# Patient Record
Sex: Male | Born: 1970 | Race: White | Hispanic: No | Marital: Married | Smoking: Current every day smoker
Health system: Southern US, Community
[De-identification: ages and names within clinical notes are randomized; demographics above are authoritative.]

## PROBLEM LIST (undated history)

## (undated) DIAGNOSIS — M1990 Unspecified osteoarthritis, unspecified site: Secondary | ICD-10-CM

## (undated) DIAGNOSIS — Z889 Allergy status to unspecified drugs, medicaments and biological substances status: Secondary | ICD-10-CM

## (undated) HISTORY — PX: KNEE ARTHROSCOPY W/ MENISCAL REPAIR: SHX1877

## (undated) HISTORY — PX: KNEE ARTHROSCOPY: SUR90

## (undated) HISTORY — PX: SHOULDER ARTHROSCOPY: SHX128

---

## 2010-07-05 ENCOUNTER — Other Ambulatory Visit: Payer: Self-pay | Admitting: Orthopedic Surgery

## 2010-07-05 DIAGNOSIS — M25561 Pain in right knee: Secondary | ICD-10-CM

## 2010-07-12 ENCOUNTER — Ambulatory Visit
Admission: RE | Admit: 2010-07-12 | Discharge: 2010-07-12 | Disposition: A | Discharge status: Home / Self Care | Payer: 59 | Source: Ambulatory Visit | Attending: Orthopedic Surgery | Admitting: Orthopedic Surgery

## 2010-07-12 DIAGNOSIS — M25561 Pain in right knee: Secondary | ICD-10-CM

## 2013-05-27 ENCOUNTER — Other Ambulatory Visit: Payer: Self-pay | Admitting: Orthopedic Surgery

## 2013-05-27 DIAGNOSIS — R609 Edema, unspecified: Secondary | ICD-10-CM

## 2013-05-27 DIAGNOSIS — M25562 Pain in left knee: Secondary | ICD-10-CM

## 2013-05-30 ENCOUNTER — Ambulatory Visit
Admission: RE | Admit: 2013-05-30 | Discharge: 2013-05-30 | Disposition: A | Discharge status: Home / Self Care | Payer: 59 | Source: Ambulatory Visit | Attending: Orthopedic Surgery | Admitting: Orthopedic Surgery

## 2013-05-30 DIAGNOSIS — M25562 Pain in left knee: Secondary | ICD-10-CM

## 2013-05-30 DIAGNOSIS — R609 Edema, unspecified: Secondary | ICD-10-CM

## 2014-01-13 ENCOUNTER — Encounter (INDEPENDENT_AMBULATORY_CARE_PROVIDER_SITE_OTHER): Payer: Self-pay | Admitting: Ophthalmology

## 2014-01-22 ENCOUNTER — Encounter (INDEPENDENT_AMBULATORY_CARE_PROVIDER_SITE_OTHER): Payer: Managed Care, Other (non HMO) | Admitting: Ophthalmology

## 2014-01-22 DIAGNOSIS — H3531 Nonexudative age-related macular degeneration: Secondary | ICD-10-CM

## 2014-01-22 DIAGNOSIS — H43813 Vitreous degeneration, bilateral: Secondary | ICD-10-CM

## 2014-02-28 ENCOUNTER — Encounter (HOSPITAL_COMMUNITY): Payer: Self-pay | Admitting: Emergency Medicine

## 2014-02-28 ENCOUNTER — Emergency Department (HOSPITAL_COMMUNITY): Payer: 59

## 2014-02-28 ENCOUNTER — Emergency Department (HOSPITAL_COMMUNITY)
Admission: EM | Admit: 2014-02-28 | Discharge: 2014-03-01 | Disposition: A | Discharge status: Home / Self Care | Payer: 59 | Attending: Emergency Medicine | Admitting: Emergency Medicine

## 2014-02-28 DIAGNOSIS — S299XXA Unspecified injury of thorax, initial encounter: Secondary | ICD-10-CM | POA: Diagnosis present

## 2014-02-28 DIAGNOSIS — Y9389 Activity, other specified: Secondary | ICD-10-CM | POA: Diagnosis not present

## 2014-02-28 DIAGNOSIS — Y9289 Other specified places as the place of occurrence of the external cause: Secondary | ICD-10-CM | POA: Diagnosis not present

## 2014-02-28 DIAGNOSIS — R1033 Periumbilical pain: Secondary | ICD-10-CM

## 2014-02-28 DIAGNOSIS — S3991XA Unspecified injury of abdomen, initial encounter: Secondary | ICD-10-CM | POA: Diagnosis not present

## 2014-02-28 DIAGNOSIS — S2231XA Fracture of one rib, right side, initial encounter for closed fracture: Secondary | ICD-10-CM

## 2014-02-28 DIAGNOSIS — Y998 Other external cause status: Secondary | ICD-10-CM | POA: Diagnosis not present

## 2014-02-28 LAB — COMPREHENSIVE METABOLIC PANEL
ALK PHOS: 72 U/L (ref 39–117)
ALT: 17 U/L (ref 0–53)
ANION GAP: 3 — AB (ref 5–15)
AST: 19 U/L (ref 0–37)
Albumin: 4 g/dL (ref 3.5–5.2)
BUN: 14 mg/dL (ref 6–23)
CALCIUM: 9.1 mg/dL (ref 8.4–10.5)
CO2: 33 mmol/L — AB (ref 19–32)
Chloride: 102 mmol/L (ref 96–112)
Creatinine, Ser: 1.06 mg/dL (ref 0.50–1.35)
GFR calc Af Amer: >90 mL/min (ref >90)
GFR, EST NON AFRICAN AMERICAN: 84 mL/min — AB (ref >90)
Glucose, Bld: 106 mg/dL — ABNORMAL HIGH (ref 70–99)
Potassium: 3.7 mmol/L (ref 3.5–5.1)
Sodium: 138 mmol/L (ref 135–145)
TOTAL PROTEIN: 6.9 g/dL (ref 6.0–8.3)
Total Bilirubin: 0.6 mg/dL (ref 0.3–1.2)

## 2014-02-28 LAB — CBC WITH DIFFERENTIAL/PLATELET
BASOS ABS: 0 10*3/uL (ref 0.0–0.1)
Basophils Relative: 0 % (ref 0–1)
EOS ABS: 0.5 10*3/uL (ref 0.0–0.7)
EOS PCT: 5 % (ref 0–5)
HCT: 39 % (ref 39.0–52.0)
Hemoglobin: 13.4 g/dL (ref 13.0–17.0)
Lymphocytes Relative: 27 % (ref 12–46)
Lymphs Abs: 2.3 10*3/uL (ref 0.7–4.0)
MCH: 30 pg (ref 26.0–34.0)
MCHC: 34.4 g/dL (ref 30.0–36.0)
MCV: 87.2 fL (ref 78.0–100.0)
MONO ABS: 0.6 10*3/uL (ref 0.1–1.0)
Monocytes Relative: 7 % (ref 3–12)
Neutro Abs: 5.1 10*3/uL (ref 1.7–7.7)
Neutrophils Relative %: 61 % (ref 43–77)
Platelets: 340 10*3/uL (ref 150–400)
RBC: 4.47 MIL/uL (ref 4.22–5.81)
RDW: 12.9 % (ref 11.5–15.5)
WBC: 8.4 10*3/uL (ref 4.0–10.5)

## 2014-02-28 LAB — URINALYSIS, ROUTINE W REFLEX MICROSCOPIC
Bilirubin Urine: NEGATIVE
Glucose, UA: NEGATIVE mg/dL
Hgb urine dipstick: NEGATIVE
Ketones, ur: NEGATIVE mg/dL
Leukocytes, UA: NEGATIVE
Nitrite: NEGATIVE
Protein, ur: NEGATIVE mg/dL
SPECIFIC GRAVITY, URINE: 1.012 (ref 1.005–1.030)
Urobilinogen, UA: 0.2 mg/dL (ref 0.0–1.0)
pH: 6 (ref 5.0–8.0)

## 2014-02-28 LAB — LIPASE, BLOOD: LIPASE: 29 U/L (ref 11–59)

## 2014-02-28 MED ORDER — IOHEXOL 300 MG/ML  SOLN
25.0000 mL | Freq: Once | INTRAMUSCULAR | Status: AC | PRN
  Administered 2014-02-28: 25 mL via ORAL

## 2014-02-28 MED ORDER — SODIUM CHLORIDE 0.9 % IV SOLN
INTRAVENOUS | Status: DC
  Administered 2014-02-28: 23:00:00 via INTRAVENOUS

## 2014-02-28 MED ORDER — IOHEXOL 300 MG/ML  SOLN
100.0000 mL | Freq: Once | INTRAMUSCULAR | Status: AC | PRN
  Administered 2014-02-28: 100 mL via INTRAVENOUS

## 2014-02-28 NOTE — ED Notes (Signed)
CT notified pt done with contrast. 
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
## 2014-03-01 MED ORDER — CEPHALEXIN 500 MG PO CAPS: 500.0000 mg | ORAL_CAPSULE | Freq: Four times a day (QID) | ORAL | Status: DC

## 2014-03-01 MED ORDER — OXYCODONE-ACETAMINOPHEN 5-325 MG PO TABS: ORAL_TABLET | ORAL | Status: DC

## 2014-03-01 MED ORDER — SULFAMETHOXAZOLE-TRIMETHOPRIM 800-160 MG PO TABS
1.0000 | ORAL_TABLET | Freq: Once | ORAL | Status: AC
  Administered 2014-03-01: 1 via ORAL
  Filled 2014-03-01: qty 1

## 2014-03-01 MED ORDER — CEPHALEXIN 250 MG PO CAPS
500.0000 mg | ORAL_CAPSULE | Freq: Once | ORAL | Status: AC
  Administered 2014-03-01: 500 mg via ORAL
  Filled 2014-03-01: qty 2

## 2014-03-01 MED ORDER — KETOROLAC TROMETHAMINE 30 MG/ML IJ SOLN
30.0000 mg | Freq: Once | INTRAMUSCULAR | Status: AC
  Administered 2014-03-01: 30 mg via INTRAVENOUS
  Filled 2014-03-01: qty 1

## 2014-03-01 MED ORDER — SULFAMETHOXAZOLE-TRIMETHOPRIM 800-160 MG PO TABS: 1.0000 | ORAL_TABLET | Freq: Two times a day (BID) | ORAL | Status: DC

## 2014-03-01 NOTE — ED Notes (Signed)
PA Nicole at bedside. 

## 2014-03-01 NOTE — Discharge Instructions (Signed)
Take percocet for breakthrough pain, do not drink alcohol, drive, care for children or do other critical tasks while taking percocet.   It is very important that you take deep breaths to prevent lung collapse and infection.  Take 10 deep breaths every hour to prevent lung collapse.  If you develop cough, fever or shortness of breath return immediately to the emergency room.  If you see signs of infection (warmth, redness, tenderness, pus, sharp increase in pain, fever, red streaking) immediately return to the emergency department.

## 2014-03-01 NOTE — ED Notes (Signed)
Pt provided and educated on [employer].

## 2014-05-09 ENCOUNTER — Other Ambulatory Visit: Payer: Self-pay | Admitting: Orthopedic Surgery

## 2014-05-09 ENCOUNTER — Ambulatory Visit
Admission: RE | Admit: 2014-05-09 | Discharge: 2014-05-09 | Disposition: A | Discharge status: Home / Self Care | Payer: 59 | Source: Ambulatory Visit | Attending: Orthopedic Surgery | Admitting: Orthopedic Surgery

## 2014-05-09 DIAGNOSIS — M25562 Pain in left knee: Secondary | ICD-10-CM

## 2014-11-21 ENCOUNTER — Other Ambulatory Visit: Payer: Self-pay | Admitting: Orthopedic Surgery

## 2014-11-21 DIAGNOSIS — M25512 Pain in left shoulder: Secondary | ICD-10-CM

## 2014-11-28 ENCOUNTER — Ambulatory Visit
Admission: RE | Admit: 2014-11-28 | Discharge: 2014-11-28 | Disposition: A | Discharge status: Home / Self Care | Payer: Managed Care, Other (non HMO) | Source: Ambulatory Visit | Attending: Orthopedic Surgery | Admitting: Orthopedic Surgery

## 2014-11-28 DIAGNOSIS — M25512 Pain in left shoulder: Secondary | ICD-10-CM

## 2015-01-26 ENCOUNTER — Ambulatory Visit (INDEPENDENT_AMBULATORY_CARE_PROVIDER_SITE_OTHER): Payer: Managed Care, Other (non HMO) | Admitting: Ophthalmology

## 2015-03-02 ENCOUNTER — Ambulatory Visit (INDEPENDENT_AMBULATORY_CARE_PROVIDER_SITE_OTHER): Payer: Managed Care, Other (non HMO) | Admitting: Ophthalmology

## 2015-07-31 ENCOUNTER — Other Ambulatory Visit: Payer: Self-pay | Admitting: Orthopedic Surgery

## 2015-08-27 ENCOUNTER — Other Ambulatory Visit (HOSPITAL_COMMUNITY): Payer: Self-pay

## 2015-08-27 NOTE — Pre-Procedure Instructions (Signed)
Aliene AltesMichael S Stockley  08/27/2015     Your procedure is scheduled on : Monday September 07, 2015 at 10:30 AM.  [facility] Admitting at 8:30 AM.  Call this number if you have problems the morning of surgery: [phone]    Remember:  Do not eat food or drink liquids after midnight.  Take these medicines the morning of surgery with A SIP OF WATER : NONE  Stop taking any vitamins, herbal medications, NSAIDs, Ibuprofen, Advil, Motrin,Aleve, etc on Monday August 14th   Do not wear jewelry.  Do not wear lotions, powders, or cologne.    Men may shave face and neck.  Do not bring valuables to the hospital.  [facility] is not responsible for any belongings or valuables.  Contacts, dentures or bridgework may not be worn into surgery.  Leave your suitcase in the car.  After surgery it may be brought to your room.  For patients admitted to the hospital, discharge time will be determined by your treatment team.  Patients discharged the day of surgery will not be allowed to drive home.   Name and phone number of your driver:    Special instructions:  Shower using CHG soap the night before and the morning of your surgery  Please read over the following fact sheets that you were given. MRSA Information and Surgical Site Infection Prevention

## 2015-08-28 ENCOUNTER — Ambulatory Visit (HOSPITAL_COMMUNITY)
Admission: RE | Admit: 2015-08-28 | Discharge: 2015-08-28 | Disposition: A | Discharge status: Home / Self Care | Payer: Managed Care, Other (non HMO) | Source: Ambulatory Visit | Attending: Orthopedic Surgery | Admitting: Orthopedic Surgery

## 2015-08-28 ENCOUNTER — Other Ambulatory Visit (HOSPITAL_COMMUNITY): Payer: Self-pay

## 2015-08-28 ENCOUNTER — Encounter (HOSPITAL_COMMUNITY)
Admission: RE | Admit: 2015-08-28 | Discharge: 2015-08-28 | Disposition: A | Discharge status: Home / Self Care | Payer: Managed Care, Other (non HMO) | Source: Ambulatory Visit | Attending: Orthopedic Surgery | Admitting: Orthopedic Surgery

## 2015-08-28 ENCOUNTER — Encounter (HOSPITAL_COMMUNITY): Payer: Self-pay

## 2015-08-28 DIAGNOSIS — Z01812 Encounter for preprocedural laboratory examination: Secondary | ICD-10-CM | POA: Insufficient documentation

## 2015-08-28 DIAGNOSIS — Z01818 Encounter for other preprocedural examination: Secondary | ICD-10-CM | POA: Diagnosis present

## 2015-08-28 DIAGNOSIS — M1712 Unilateral primary osteoarthritis, left knee: Secondary | ICD-10-CM | POA: Insufficient documentation

## 2015-08-28 LAB — CBC WITH DIFFERENTIAL/PLATELET
Basophils Absolute: 0.1 10*3/uL (ref 0.0–0.1)
Basophils Relative: 1 %
EOS ABS: 0.3 10*3/uL (ref 0.0–0.7)
EOS PCT: 4 %
HCT: 43.2 % (ref 39.0–52.0)
Hemoglobin: 14.4 g/dL (ref 13.0–17.0)
LYMPHS ABS: 2.1 10*3/uL (ref 0.7–4.0)
Lymphocytes Relative: 30 %
MCH: 29.8 pg (ref 26.0–34.0)
MCHC: 33.3 g/dL (ref 30.0–36.0)
MCV: 89.4 fL (ref 78.0–100.0)
MONOS PCT: 7 %
Monocytes Absolute: 0.5 10*3/uL (ref 0.1–1.0)
Neutro Abs: 4 10*3/uL (ref 1.7–7.7)
Neutrophils Relative %: 58 %
PLATELETS: 314 10*3/uL (ref 150–400)
RBC: 4.83 MIL/uL (ref 4.22–5.81)
RDW: 12.5 % (ref 11.5–15.5)
WBC: 6.9 10*3/uL (ref 4.0–10.5)

## 2015-08-28 LAB — COMPREHENSIVE METABOLIC PANEL
ALK PHOS: 59 U/L (ref 38–126)
ALT: 23 U/L (ref 17–63)
ANION GAP: 11 (ref 5–15)
AST: 23 U/L (ref 15–41)
Albumin: 4.4 g/dL (ref 3.5–5.0)
BUN: 13 mg/dL (ref 6–20)
CALCIUM: 9.6 mg/dL (ref 8.9–10.3)
CHLORIDE: 100 mmol/L — AB (ref 101–111)
CO2: 27 mmol/L (ref 22–32)
Creatinine, Ser: 0.96 mg/dL (ref 0.61–1.24)
Glucose, Bld: 92 mg/dL (ref 65–99)
Potassium: 3.7 mmol/L (ref 3.5–5.1)
SODIUM: 138 mmol/L (ref 135–145)
Total Bilirubin: 0.9 mg/dL (ref 0.3–1.2)
Total Protein: 7.2 g/dL (ref 6.5–8.1)

## 2015-08-28 LAB — URINALYSIS, ROUTINE W REFLEX MICROSCOPIC
Bilirubin Urine: NEGATIVE
Glucose, UA: NEGATIVE mg/dL
HGB URINE DIPSTICK: NEGATIVE
KETONES UR: NEGATIVE mg/dL
Leukocytes, UA: NEGATIVE
Nitrite: NEGATIVE
PROTEIN: NEGATIVE mg/dL
Specific Gravity, Urine: 1.009 (ref 1.005–1.030)
pH: 6 (ref 5.0–8.0)

## 2015-08-28 LAB — PROTIME-INR
INR: 1.06
PROTHROMBIN TIME: 13.8 s (ref 11.4–15.2)

## 2015-08-28 LAB — SURGICAL PCR SCREEN
MRSA, PCR: NEGATIVE
STAPHYLOCOCCUS AUREUS: POSITIVE — AB

## 2015-08-28 LAB — APTT: aPTT: 28 seconds (ref 24–36)

## 2015-08-28 NOTE — Progress Notes (Signed)
Prescription for Mupirocin called into [facility], and Nurse called and left a voicemail instructing patient to pick up ointment at his earliest convenience. Direct call back number left.

## 2015-08-28 NOTE — Progress Notes (Signed)
Patient denied having any acute cardiac or pulmonary issues, but did inform Nurse that he was diagnosed with sleep apnea several years ago, however he does not wear CPAP as prescribed. Patient stated he needed to "follow up with it," as it is outdated. Sleep apnea results routed to patients PCP.  Patient informed Nurse that he had a EKG done approximately 2 weeks ago at his PCP office. Will request records.

## 2015-08-28 NOTE — Progress Notes (Signed)
   08/28/15 1043  OBSTRUCTIVE SLEEP APNEA  Have you ever been diagnosed with sleep apnea through a sleep study? Yes  If yes, do you have and use a CPAP or BPAP machine every night? 0 (Pt does not wear CPAP)  Do you snore loudly (loud enough to be heard through closed doors)?  1  Do you often feel tired, fatigued, or sleepy during the daytime (such as falling asleep during driving or talking to someone)? 0  Has anyone observed you stop breathing during your sleep? 1  Do you have, or are you being treated for high blood pressure? 0  BMI more than 35 kg/m2? 0  Age > 50 (1-yes) 0  Neck circumference greater than:Male 16 inches or larger, Male 17inches or larger? 1  Male Gender (Yes=1) 1  Obstructive Sleep Apnea Score 4  Score 5 or greater  Results sent to PCP   This patient has screened at risk for sleep apnea using the STOP Bang tool used during a pre-surgical visit. Patient stated he was diagnosed with sleep apnea several years ago, but needs to follow up with this because his equipment is outdated.

## 2015-08-29 LAB — URINE CULTURE: CULTURE: NO GROWTH

## 2015-08-31 NOTE — Progress Notes (Signed)
[facility] in regards to re-requesting most recent labs,EKG,and OV

## 2015-09-04 MED ORDER — BUPIVACAINE LIPOSOME 1.3 % IJ SUSP
20.0000 mL | INTRAMUSCULAR | Status: AC
  Administered 2015-09-07: 20 mL
  Filled 2015-09-04: qty 20

## 2015-09-04 MED ORDER — TRANEXAMIC ACID 1000 MG/10ML IV SOLN
1000.0000 mg | INTRAVENOUS | Status: AC
  Administered 2015-09-07: 1000 mg via INTRAVENOUS
  Filled 2015-09-04: qty 10

## 2015-09-06 MED ORDER — SODIUM CHLORIDE 0.9 % IV SOLN: INTRAVENOUS | Status: DC

## 2015-09-06 MED ORDER — ACETAMINOPHEN 500 MG PO TABS
1000.0000 mg | ORAL_TABLET | Freq: Once | ORAL | Status: AC
  Administered 2015-09-07: 1000 mg via ORAL
  Filled 2015-09-06: qty 2

## 2015-09-06 MED ORDER — CEFAZOLIN SODIUM-DEXTROSE 2-4 GM/100ML-% IV SOLN
2.0000 g | INTRAVENOUS | Status: AC
  Administered 2015-09-07: 2 g via INTRAVENOUS
  Filled 2015-09-06: qty 100

## 2015-09-07 ENCOUNTER — Inpatient Hospital Stay (HOSPITAL_COMMUNITY): Payer: Managed Care, Other (non HMO) | Admitting: Certified Registered Nurse Anesthetist

## 2015-09-07 ENCOUNTER — Encounter (HOSPITAL_COMMUNITY): Payer: Self-pay | Admitting: Surgery

## 2015-09-07 ENCOUNTER — Inpatient Hospital Stay (HOSPITAL_COMMUNITY)
Admission: RE | Admit: 2015-09-07 | Discharge: 2015-09-08 | DRG: 470 | Disposition: A | Discharge status: Home / Self Care | Payer: Managed Care, Other (non HMO) | Source: Ambulatory Visit | Attending: Orthopedic Surgery | Admitting: Orthopedic Surgery

## 2015-09-07 ENCOUNTER — Encounter (HOSPITAL_COMMUNITY)
Admission: RE | Disposition: A | Discharge status: Home / Self Care | Payer: Self-pay | Source: Ambulatory Visit | Attending: Orthopedic Surgery

## 2015-09-07 DIAGNOSIS — M1712 Unilateral primary osteoarthritis, left knee: Secondary | ICD-10-CM | POA: Diagnosis present

## 2015-09-07 DIAGNOSIS — D62 Acute posthemorrhagic anemia: Secondary | ICD-10-CM | POA: Diagnosis not present

## 2015-09-07 DIAGNOSIS — Z96659 Presence of unspecified artificial knee joint: Secondary | ICD-10-CM

## 2015-09-07 DIAGNOSIS — F17290 Nicotine dependence, other tobacco product, uncomplicated: Secondary | ICD-10-CM | POA: Diagnosis present

## 2015-09-07 HISTORY — DX: Allergy status to unspecified drugs, medicaments and biological substances: Z88.9

## 2015-09-07 HISTORY — DX: Unspecified osteoarthritis, unspecified site: M19.90

## 2015-09-07 HISTORY — PX: TOTAL KNEE ARTHROPLASTY: SHX125

## 2015-09-07 SURGERY — ARTHROPLASTY, KNEE, TOTAL
Anesthesia: Spinal | Site: Knee | Laterality: Left

## 2015-09-07 MED ORDER — SODIUM CHLORIDE FLUSH 0.9 % IV SOLN
INTRAVENOUS | Status: AC
  Filled 2015-09-07: qty 20

## 2015-09-07 MED ORDER — SODIUM CHLORIDE 0.9 % IJ SOLN
INTRAMUSCULAR | Status: DC | PRN
  Administered 2015-09-07: 20 mL

## 2015-09-07 MED ORDER — SODIUM CHLORIDE 0.9 % IV SOLN
INTRAVENOUS | Status: DC
  Administered 2015-09-07: 16:00:00 via INTRAVENOUS

## 2015-09-07 MED ORDER — BISACODYL 5 MG PO TBEC: 5.0000 mg | DELAYED_RELEASE_TABLET | Freq: Every day | ORAL | Status: DC | PRN

## 2015-09-07 MED ORDER — BUPIVACAINE-EPINEPHRINE (PF) 0.25% -1:200000 IJ SOLN
INTRAMUSCULAR | Status: DC | PRN
  Administered 2015-09-07: 30 mL

## 2015-09-07 MED ORDER — SENNOSIDES-DOCUSATE SODIUM 8.6-50 MG PO TABS
1.0000 | ORAL_TABLET | Freq: Every evening | ORAL | Status: DC | PRN
Start: 2015-09-07 — End: 2015-09-08

## 2015-09-07 MED ORDER — ACETAMINOPHEN 500 MG PO TABS
1000.0000 mg | ORAL_TABLET | Freq: Four times a day (QID) | ORAL | Status: AC
  Administered 2015-09-07 – 2015-09-08 (×4): 1000 mg via ORAL
  Filled 2015-09-07 (×4): qty 2

## 2015-09-07 MED ORDER — METOCLOPRAMIDE HCL 5 MG/ML IJ SOLN: 5.0000 mg | Freq: Three times a day (TID) | INTRAMUSCULAR | Status: DC | PRN

## 2015-09-07 MED ORDER — SODIUM CHLORIDE 0.9 % IR SOLN
Status: DC | PRN
  Administered 2015-09-07: 3000 mL

## 2015-09-07 MED ORDER — METOCLOPRAMIDE HCL 5 MG PO TABS: 5.0000 mg | ORAL_TABLET | Freq: Three times a day (TID) | ORAL | Status: DC | PRN

## 2015-09-07 MED ORDER — ONDANSETRON HCL 4 MG/2ML IJ SOLN
INTRAMUSCULAR | Status: AC
  Filled 2015-09-07: qty 2

## 2015-09-07 MED ORDER — MIDAZOLAM HCL 2 MG/2ML IJ SOLN
INTRAMUSCULAR | Status: AC
  Filled 2015-09-07: qty 2

## 2015-09-07 MED ORDER — MENTHOL 3 MG MT LOZG: 1.0000 | LOZENGE | OROMUCOSAL | Status: DC | PRN

## 2015-09-07 MED ORDER — 0.9 % SODIUM CHLORIDE (POUR BTL) OPTIME
TOPICAL | Status: DC | PRN
  Administered 2015-09-07: 1000 mL

## 2015-09-07 MED ORDER — PHENYLEPHRINE 40 MCG/ML (10ML) SYRINGE FOR IV PUSH (FOR BLOOD PRESSURE SUPPORT)
PREFILLED_SYRINGE | INTRAVENOUS | Status: AC
  Filled 2015-09-07: qty 10

## 2015-09-07 MED ORDER — ROPIVACAINE HCL 5 MG/ML IJ SOLN
INTRAMUSCULAR | Status: DC | PRN
  Administered 2015-09-07: 30 mL via PERINEURAL

## 2015-09-07 MED ORDER — FENTANYL CITRATE (PF) 100 MCG/2ML IJ SOLN
INTRAMUSCULAR | Status: DC | PRN
  Administered 2015-09-07: 50 ug via INTRAVENOUS

## 2015-09-07 MED ORDER — PROPOFOL 500 MG/50ML IV EMUL
INTRAVENOUS | Status: DC | PRN
  Administered 2015-09-07: 75 ug/kg/min via INTRAVENOUS

## 2015-09-07 MED ORDER — ZOLPIDEM TARTRATE 5 MG PO TABS: 5.0000 mg | ORAL_TABLET | Freq: Every evening | ORAL | Status: DC | PRN

## 2015-09-07 MED ORDER — OXYCODONE HCL ER 10 MG PO T12A
10.0000 mg | EXTENDED_RELEASE_TABLET | Freq: Two times a day (BID) | ORAL | Status: DC
  Administered 2015-09-07 – 2015-09-08 (×2): 10 mg via ORAL
  Filled 2015-09-07 (×2): qty 1

## 2015-09-07 MED ORDER — BUPIVACAINE-EPINEPHRINE (PF) 0.25% -1:200000 IJ SOLN
INTRAMUSCULAR | Status: AC
  Filled 2015-09-07: qty 30

## 2015-09-07 MED ORDER — MIDAZOLAM HCL 5 MG/5ML IJ SOLN
INTRAMUSCULAR | Status: DC | PRN
  Administered 2015-09-07: 2 mg via INTRAVENOUS

## 2015-09-07 MED ORDER — OXYCODONE HCL 5 MG PO TABS
5.0000 mg | ORAL_TABLET | ORAL | Status: DC | PRN
  Administered 2015-09-07 – 2015-09-08 (×3): 10 mg via ORAL
  Filled 2015-09-07 (×3): qty 2
  Filled 2015-09-07: qty 1

## 2015-09-07 MED ORDER — LIDOCAINE HCL (CARDIAC) 20 MG/ML IV SOLN
INTRAVENOUS | Status: DC | PRN
  Administered 2015-09-07 (×2): 10 mg via INTRATRACHEAL

## 2015-09-07 MED ORDER — DOCUSATE SODIUM 100 MG PO CAPS
100.0000 mg | ORAL_CAPSULE | Freq: Two times a day (BID) | ORAL | Status: DC
  Administered 2015-09-07 – 2015-09-08 (×2): 100 mg via ORAL
  Filled 2015-09-07 (×2): qty 1

## 2015-09-07 MED ORDER — HYDROMORPHONE HCL 1 MG/ML IJ SOLN
1.0000 mg | INTRAMUSCULAR | Status: DC | PRN
  Administered 2015-09-07 – 2015-09-08 (×7): 1 mg via INTRAVENOUS
  Filled 2015-09-07 (×7): qty 1

## 2015-09-07 MED ORDER — PHENOL 1.4 % MT LIQD: 1.0000 | OROMUCOSAL | Status: DC | PRN

## 2015-09-07 MED ORDER — ROPIVACAINE HCL 5 MG/ML IJ SOLN: INTRAMUSCULAR | Status: DC | PRN

## 2015-09-07 MED ORDER — FENTANYL CITRATE (PF) 100 MCG/2ML IJ SOLN
INTRAMUSCULAR | Status: AC
  Filled 2015-09-07: qty 2

## 2015-09-07 MED ORDER — METHOCARBAMOL 1000 MG/10ML IJ SOLN
500.0000 mg | Freq: Four times a day (QID) | INTRAVENOUS | Status: DC | PRN
  Filled 2015-09-07: qty 5

## 2015-09-07 MED ORDER — CEFAZOLIN IN D5W 1 GM/50ML IV SOLN
1.0000 g | Freq: Four times a day (QID) | INTRAVENOUS | Status: AC
  Administered 2015-09-07 – 2015-09-08 (×2): 1 g via INTRAVENOUS
  Filled 2015-09-07 (×2): qty 50

## 2015-09-07 MED ORDER — ONDANSETRON HCL 4 MG/2ML IJ SOLN
INTRAMUSCULAR | Status: DC | PRN
  Administered 2015-09-07: 4 mg via INTRAVENOUS

## 2015-09-07 MED ORDER — FLUOXETINE HCL 10 MG PO CAPS
10.0000 mg | ORAL_CAPSULE | Freq: Every day | ORAL | Status: DC
  Administered 2015-09-07 – 2015-09-08 (×2): 10 mg via ORAL
  Filled 2015-09-07 (×2): qty 1

## 2015-09-07 MED ORDER — ASPIRIN EC 325 MG PO TBEC
325.0000 mg | DELAYED_RELEASE_TABLET | Freq: Two times a day (BID) | ORAL | Status: DC
  Administered 2015-09-07 – 2015-09-08 (×2): 325 mg via ORAL
  Filled 2015-09-07 (×2): qty 1

## 2015-09-07 MED ORDER — HYDROMORPHONE HCL 1 MG/ML IJ SOLN: 0.2500 mg | INTRAMUSCULAR | Status: DC | PRN

## 2015-09-07 MED ORDER — METHOCARBAMOL 500 MG PO TABS
500.0000 mg | ORAL_TABLET | Freq: Four times a day (QID) | ORAL | Status: DC | PRN
  Administered 2015-09-07 – 2015-09-08 (×4): 500 mg via ORAL
  Filled 2015-09-07 (×4): qty 1

## 2015-09-07 MED ORDER — FENTANYL CITRATE (PF) 100 MCG/2ML IJ SOLN
100.0000 ug | Freq: Once | INTRAMUSCULAR | Status: AC
  Administered 2015-09-07: 100 ug via INTRAVENOUS

## 2015-09-07 MED ORDER — DIPHENHYDRAMINE HCL 12.5 MG/5ML PO ELIX
12.5000 mg | ORAL_SOLUTION | ORAL | Status: DC | PRN
  Administered 2015-09-07: 25 mg via ORAL
  Filled 2015-09-07: qty 10

## 2015-09-07 MED ORDER — FENTANYL CITRATE (PF) 100 MCG/2ML IJ SOLN
INTRAMUSCULAR | Status: AC
  Administered 2015-09-07: 100 ug via INTRAVENOUS
  Filled 2015-09-07: qty 2

## 2015-09-07 MED ORDER — PROPOFOL 10 MG/ML IV BOLUS
INTRAVENOUS | Status: DC | PRN
  Administered 2015-09-07 (×3): 10 mg via INTRAVENOUS

## 2015-09-07 MED ORDER — FLUOXETINE HCL 20 MG PO TABS
10.0000 mg | ORAL_TABLET | Freq: Every day | ORAL | Status: DC
Start: 2015-09-07 — End: 2015-09-07
  Filled 2015-09-07: qty 1

## 2015-09-07 MED ORDER — DEXAMETHASONE SODIUM PHOSPHATE 10 MG/ML IJ SOLN
10.0000 mg | Freq: Once | INTRAMUSCULAR | Status: AC
  Administered 2015-09-08: 10 mg via INTRAVENOUS
  Filled 2015-09-07: qty 1

## 2015-09-07 MED ORDER — CHLORHEXIDINE GLUCONATE 4 % EX LIQD: 60.0000 mL | Freq: Once | CUTANEOUS | Status: DC

## 2015-09-07 MED ORDER — MIDAZOLAM HCL 2 MG/2ML IJ SOLN
2.0000 mg | Freq: Once | INTRAMUSCULAR | Status: AC
  Administered 2015-09-07: 2 mg via INTRAVENOUS

## 2015-09-07 MED ORDER — ACETAMINOPHEN 650 MG RE SUPP: 650.0000 mg | Freq: Four times a day (QID) | RECTAL | Status: DC | PRN

## 2015-09-07 MED ORDER — LACTATED RINGERS IV SOLN
INTRAVENOUS | Status: DC
  Administered 2015-09-07 (×3): via INTRAVENOUS

## 2015-09-07 MED ORDER — PROPOFOL 10 MG/ML IV BOLUS
INTRAVENOUS | Status: AC
  Filled 2015-09-07: qty 20

## 2015-09-07 MED ORDER — ONDANSETRON HCL 4 MG PO TABS
4.0000 mg | ORAL_TABLET | Freq: Four times a day (QID) | ORAL | Status: DC | PRN
  Administered 2015-09-08: 4 mg via ORAL
  Filled 2015-09-07: qty 1

## 2015-09-07 MED ORDER — MIDAZOLAM HCL 2 MG/2ML IJ SOLN
INTRAMUSCULAR | Status: AC
  Administered 2015-09-07: 2 mg via INTRAVENOUS
  Filled 2015-09-07: qty 2

## 2015-09-07 MED ORDER — BUPIVACAINE IN DEXTROSE 0.75-8.25 % IT SOLN
INTRATHECAL | Status: DC | PRN
  Administered 2015-09-07: 15 mg via INTRATHECAL

## 2015-09-07 MED ORDER — ALUM & MAG HYDROXIDE-SIMETH 200-200-20 MG/5ML PO SUSP: 30.0000 mL | ORAL | Status: DC | PRN

## 2015-09-07 MED ORDER — ACETAMINOPHEN 325 MG PO TABS
650.0000 mg | ORAL_TABLET | Freq: Four times a day (QID) | ORAL | Status: DC | PRN
Start: 2015-09-07 — End: 2015-09-08

## 2015-09-07 MED ORDER — ONDANSETRON HCL 4 MG/2ML IJ SOLN
4.0000 mg | Freq: Four times a day (QID) | INTRAMUSCULAR | Status: DC | PRN
  Administered 2015-09-07: 4 mg via INTRAVENOUS

## 2015-09-07 MED ORDER — MEPERIDINE HCL 25 MG/ML IJ SOLN: 6.2500 mg | INTRAMUSCULAR | Status: DC | PRN

## 2015-09-07 MED ORDER — TRANEXAMIC ACID 1000 MG/10ML IV SOLN
1000.0000 mg | Freq: Once | INTRAVENOUS | Status: AC
  Administered 2015-09-07: 1000 mg via INTRAVENOUS
  Filled 2015-09-07: qty 10

## 2015-09-07 MED ORDER — PROMETHAZINE HCL 25 MG/ML IJ SOLN: 6.2500 mg | INTRAMUSCULAR | Status: DC | PRN

## 2015-09-07 MED ORDER — FLEET ENEMA 7-19 GM/118ML RE ENEM: 1.0000 | ENEMA | Freq: Once | RECTAL | Status: DC | PRN

## 2015-09-07 SURGICAL SUPPLY — 64 items
BANDAGE ACE 6X5 VEL STRL LF (GAUZE/BANDAGES/DRESSINGS) ×2 IMPLANT
BANDAGE ESMARK 6X9 LF (GAUZE/BANDAGES/DRESSINGS) ×1 IMPLANT
BLADE SAGITTAL 13X1.27X60 (BLADE) ×2 IMPLANT
BLADE SAGITTAL 13X1.27X60MM (BLADE) ×1
BLADE SAW SGTL 83.5X18.5 (BLADE) ×3 IMPLANT
BLADE SURG 10 STRL SS (BLADE) ×3 IMPLANT
BNDG CMPR 9X6 STRL LF SNTH (GAUZE/BANDAGES/DRESSINGS) ×1
BNDG ESMARK 6X9 LF (GAUZE/BANDAGES/DRESSINGS) ×3
BOWL SMART MIX CTS (DISPOSABLE) ×3 IMPLANT
CAPT KNEE TOTAL 3 ×3 IMPLANT
CEMENT BONE SIMPLEX SPEEDSET (Cement) ×6 IMPLANT
CLOSURE WOUND 1/2 X4 (GAUZE/BANDAGES/DRESSINGS) ×1
COVER SURGICAL LIGHT HANDLE (MISCELLANEOUS) ×3 IMPLANT
CUFF TOURNIQUET SINGLE 34IN LL (TOURNIQUET CUFF) ×3 IMPLANT
DRAPE EXTREMITY T 121X128X90 (DRAPE) ×3 IMPLANT
DRAPE INCISE IOBAN 66X45 STRL (DRAPES) ×6 IMPLANT
DRAPE PROXIMA HALF (DRAPES) IMPLANT
DRAPE U-SHAPE 47X51 STRL (DRAPES) ×3 IMPLANT
DRSG AQUACEL AG ADV 3.5X10 (GAUZE/BANDAGES/DRESSINGS) ×3 IMPLANT
DRSG PAD ABDOMINAL 8X10 ST (GAUZE/BANDAGES/DRESSINGS) ×3 IMPLANT
DURAPREP 26ML APPLICATOR (WOUND CARE) ×6 IMPLANT
ELECT REM PT RETURN 9FT ADLT (ELECTROSURGICAL) ×3
ELECTRODE REM PT RTRN 9FT ADLT (ELECTROSURGICAL) ×1 IMPLANT
GLOVE BIO SURGEON STRL SZ7.5 (GLOVE) ×2 IMPLANT
GLOVE BIOGEL M 7.0 STRL (GLOVE) IMPLANT
GLOVE BIOGEL PI IND STRL 7.5 (GLOVE) IMPLANT
GLOVE BIOGEL PI IND STRL 8 (GLOVE) IMPLANT
GLOVE BIOGEL PI IND STRL 8.5 (GLOVE) ×5 IMPLANT
GLOVE BIOGEL PI INDICATOR 7.5 (GLOVE)
GLOVE BIOGEL PI INDICATOR 8 (GLOVE) ×2
GLOVE BIOGEL PI INDICATOR 8.5 (GLOVE) ×2
GLOVE SURG ORTHO 8.0 STRL STRW (GLOVE) ×14 IMPLANT
GOWN STRL REUS W/ TWL LRG LVL3 (GOWN DISPOSABLE) ×1 IMPLANT
GOWN STRL REUS W/ TWL XL LVL3 (GOWN DISPOSABLE) ×2 IMPLANT
GOWN STRL REUS W/TWL 2XL LVL3 (GOWN DISPOSABLE) ×3 IMPLANT
GOWN STRL REUS W/TWL LRG LVL3 (GOWN DISPOSABLE)
GOWN STRL REUS W/TWL XL LVL3 (GOWN DISPOSABLE) ×6
HANDPIECE INTERPULSE COAX TIP (DISPOSABLE) ×3
HOOD PEEL AWAY FACE SHEILD DIS (HOOD) ×9 IMPLANT
KIT BASIN OR (CUSTOM PROCEDURE TRAY) ×3 IMPLANT
KIT ROOM TURNOVER OR (KITS) ×3 IMPLANT
KNEE CAPITATED TOTAL 3 IMPLANT
MANIFOLD NEPTUNE II (INSTRUMENTS) ×3 IMPLANT
NEEDLE 22X1 1/2 (OR ONLY) (NEEDLE) ×6 IMPLANT
NS IRRIG 1000ML POUR BTL (IV SOLUTION) ×3 IMPLANT
PACK TOTAL JOINT (CUSTOM PROCEDURE TRAY) ×3 IMPLANT
PACK UNIVERSAL I (CUSTOM PROCEDURE TRAY) ×3 IMPLANT
PAD ARMBOARD 7.5X6 YLW CONV (MISCELLANEOUS) ×6 IMPLANT
SET HNDPC FAN SPRY TIP SCT (DISPOSABLE) ×1 IMPLANT
STAPLER VISISTAT 35W (STAPLE) ×3 IMPLANT
STRIP CLOSURE SKIN 1/2X4 (GAUZE/BANDAGES/DRESSINGS) ×2 IMPLANT
SUCTION FRAZIER HANDLE 10FR (MISCELLANEOUS) ×2
SUCTION TUBE FRAZIER 10FR DISP (MISCELLANEOUS) ×1 IMPLANT
SUT BONE WAX W31G (SUTURE) ×3 IMPLANT
SUT VIC AB 0 CTB1 27 (SUTURE) ×6 IMPLANT
SUT VIC AB 1 CT1 27 (SUTURE) ×9
SUT VIC AB 1 CT1 27XBRD ANBCTR (SUTURE) ×2 IMPLANT
SUT VIC AB 2-0 CT1 27 (SUTURE) ×6
SUT VIC AB 2-0 CT1 TAPERPNT 27 (SUTURE) ×2 IMPLANT
SYR 20CC LL (SYRINGE) ×6 IMPLANT
TOWEL OR 17X24 6PK STRL BLUE (TOWEL DISPOSABLE) ×3 IMPLANT
TOWEL OR 17X26 10 PK STRL BLUE (TOWEL DISPOSABLE) ×3 IMPLANT
WATER STERILE IRR 1000ML POUR (IV SOLUTION) ×2 IMPLANT
YANKAUER SUCT BULB TIP NO VENT (SUCTIONS) ×2 IMPLANT

## 2015-09-07 NOTE — Anesthesia Procedure Notes (Addendum)
Anesthesia Regional Block:  Adductor canal block  Pre-Anesthetic Checklist: ,, timeout performed, Correct Patient, Correct Site, Correct Laterality, Correct Procedure, Correct Position, site marked, Risks and benefits discussed, pre-op evaluation,  At surgeon's request and post-op pain management  Laterality: Left  Prep: Maximum Sterile Barrier Precautions used, chloraprep       Needles:  Injection technique: Single-shot  Needle Type: Echogenic Stimulator Needle     Needle Length: 9cm 9 cm Needle Gauge: 21 and 21 G    Additional Needles:  Procedures: ultrasound guided (picture in chart) Adductor canal block Narrative:  Start time: 09/07/2015 9:50 AM End time: 09/07/2015 10:00 AM Injection made incrementally with aspirations every 5 mL. Anesthesiologist: Gaynelle AduFITZGERALD, Maleko Greulich  Additional Notes: 2% Lidocaine skin wheel.

## 2015-09-07 NOTE — Anesthesia Procedure Notes (Signed)
Procedure Name: MAC Date/Time: 09/07/2015 11:31 AM Performed by: Little IshikawaMERCER, Leontina Skidmore L Pre-anesthesia Checklist: Timeout performed, Patient being monitored, Suction available, Emergency Drugs available and Patient identified Patient Re-evaluated:Patient Re-evaluated prior to inductionOxygen Delivery Method: Nasal cannula Placement Confirmation: positive ETCO2

## 2015-09-07 NOTE — Transfer of Care (Signed)
Immediate Anesthesia Transfer of Care Note  Patient: Danny Herman  Procedure(s) Performed: Procedure(s): LEFT TOTAL KNEE ARTHROPLASTY (Left)  Patient Location: PACU  Anesthesia Type:MAC and Spinal  Level of Consciousness: awake, alert  and oriented  Airway & Oxygen Therapy: Patient Spontanous Breathing and Patient connected to nasal cannula oxygen  Post-op Assessment: Report given to RN and Post -op Vital signs reviewed and stable  Post vital signs: Reviewed and stable  Last Vitals:  Vitals:   09/07/15 1005 09/07/15 1010  BP: (!) 152/88 (!) 147/77  Pulse: 65 64  Resp: 11 10  Temp:      Last Pain:  Vitals:   09/07/15 0943  TempSrc: Oral  PainSc:       Patients Stated Pain Goal: 4 (09/07/15 0931)  Complications: No apparent anesthesia complications

## 2015-09-07 NOTE — H&P (Signed)
Danny AltesMichael S Herman MRN:  [id number] DOB/SEX:  1970/05/15/male  CHIEF COMPLAINT:  Painful left Knee  HISTORY: Patient is a 45 y.o. male presented with a history of pain in the left knee. Onset of symptoms was gradual starting a few years ago with gradually worsening course since that time. Patient has been treated conservatively with over-the-counter NSAIDs and activity modification. Patient currently rates pain in the knee at 10 out of 10 with activity. There is pain at night.  PAST MEDICAL HISTORY: There are no active problems to display for this patient.  No past medical history on file. Past Surgical History:  Procedure Laterality Date  . KNEE ARTHROSCOPY Left   . KNEE ARTHROSCOPY W/ MENISCAL REPAIR Right   . SHOULDER ARTHROSCOPY Left      MEDICATIONS:   No prescriptions prior to admission.    ALLERGIES:   Allergies  Allergen Reactions  . No Known Allergies     REVIEW OF SYSTEMS:  A comprehensive review of systems was negative except for: Musculoskeletal: positive for arthralgias and bone pain   FAMILY HISTORY:  No family history on file.  SOCIAL HISTORY:   Social History  Substance Use Topics  . Smoking status: Current Every Day Smoker    Types: E-cigarettes  . Smokeless tobacco: Current User  . Alcohol use Yes     EXAMINATION:  Vital signs in last 24 hours:    There were no vitals taken for this visit.  General Appearance:    Alert, cooperative, no distress, appears stated age  Head:    Normocephalic, without obvious abnormality, atraumatic  Eyes:    PERRL, conjunctiva/corneas clear, EOM's intact, fundi    benign, both eyes       Ears:    Normal TM's and external ear canals, both ears  Nose:   Nares normal, septum midline, mucosa normal, no drainage    or sinus tenderness  Throat:   Lips, mucosa, and tongue normal; teeth and gums normal  Neck:   Supple, symmetrical, trachea midline, no adenopathy;       thyroid:  No enlargement/tenderness/nodules; no  carotid   bruit or JVD  Back:     Symmetric, no curvature, ROM normal, no CVA tenderness  Lungs:     Clear to auscultation bilaterally, respirations unlabored  Chest wall:    No tenderness or deformity  Heart:    Regular rate and rhythm, S1 and S2 normal, no murmur, rub   or gallop  Abdomen:     Soft, non-tender, bowel sounds active all four quadrants,    no masses, no organomegaly  Genitalia:    Normal male without lesion, discharge or tenderness  Rectal:    Normal tone, normal prostate, no masses or tenderness;   guaiac negative stool  Extremities:   Extremities normal, atraumatic, no cyanosis or edema  Pulses:   2+ and symmetric all extremities  Skin:   Skin color, texture, turgor normal, no rashes or lesions  Lymph nodes:   Cervical, supraclavicular, and axillary nodes normal  Neurologic:   CNII-XII intact. Normal strength, sensation and reflexes      throughout     Musculoskeletal:  ROM 0-120, Ligaments intact,  Imaging Review Plain radiographs demonstrate severe degenerative joint disease of the left knee. The overall alignment is neutral. The bone quality appears to be excellent for age and reported activity level.  Assessment/Plan: Primary osteoarthritis, left knee   The patient history, physical examination and imaging studies are consistent with advanced degenerative joint disease of  the left knee. The patient has failed conservative treatment.  The clearance notes were reviewed.  After discussion with the patient it was felt that Total Knee Replacement was indicated. The procedure,  risks, and benefits of total knee arthroplasty were presented and reviewed. The risks including but not limited to aseptic loosening, infection, blood clots, vascular injury, stiffness, patella tracking problems complications among others were discussed. The patient acknowledged the explanation, agreed to proceed with the plan. Danny SandiferColby Alan Herman 09/07/2015, 6:35 AM

## 2015-09-07 NOTE — Anesthesia Procedure Notes (Signed)
Spinal  Patient location during procedure: OR Start time: 09/07/2015 11:32 AM End time: 09/07/2015 11:37 AM Staffing Anesthesiologist: Gaynelle AduFITZGERALD, Mardell Cragg Performed: anesthesiologist  Preanesthetic Checklist Completed: patient identified, surgical consent, pre-op evaluation, timeout performed, IV checked, risks and benefits discussed and monitors and equipment checked Spinal Block Patient position: sitting Prep: Betadine Patient monitoring: cardiac monitor, continuous pulse ox and blood pressure Approach: midline Location: L3-4 Injection technique: single-shot Needle Needle type: Pencan  Needle gauge: 24 G Needle length: 9 cm Assessment Sensory level: T6 Additional Notes Functioning IV was confirmed and monitors were applied. Sterile prep and drape, including hand hygiene and sterile gloves were used. The patient was positioned and the spine was prepped. The skin was anesthetized with lidocaine.  Free flow of clear CSF was obtained prior to injecting local anesthetic into the CSF.  The spinal needle aspirated freely following injection.  The needle was carefully withdrawn.  The patient tolerated the procedure well.

## 2015-09-07 NOTE — Progress Notes (Signed)
Orthopedic Tech Progress Note Patient Details:  Aliene AltesMichael S Guedea 16-May-1970 [id number]  CPM Left Knee CPM Left Knee: On Left Knee Flexion (Degrees): 90 Left Knee Extension (Degrees): 0 Additional Comments: Trapeze bar and foot roll   Saul FordyceJennifer C Burlon Centrella 09/07/2015, 4:08 PM

## 2015-09-07 NOTE — Anesthesia Preprocedure Evaluation (Signed)
Anesthesia Evaluation  Patient identified by MRN, date of birth, ID band Patient awake    Reviewed: Allergy & Precautions, NPO status , Patient's Chart, lab work & pertinent test results  Airway Mallampati: II  TM Distance: >3 FB Neck ROM: Full    Dental no notable dental hx.    Pulmonary Current Smoker,    Pulmonary exam normal breath sounds clear to auscultation       Cardiovascular negative cardio ROS Normal cardiovascular exam Rhythm:Regular Rate:Normal     Neuro/Psych negative neurological ROS  negative psych ROS   GI/Hepatic negative GI ROS, Neg liver ROS,   Endo/Other  negative endocrine ROS  Renal/GU negative Renal ROS     Musculoskeletal negative musculoskeletal ROS (+)   Abdominal   Peds  Hematology negative hematology ROS (+)   Anesthesia Other Findings   Reproductive/Obstetrics negative OB ROS                             Anesthesia Physical Anesthesia Plan  ASA: II  Anesthesia Plan: Spinal   Post-op Pain Management:    Induction: Intravenous  Airway Management Planned:   Additional Equipment:   Intra-op Plan:   Post-operative Plan:   Informed Consent: I have reviewed the patients History and Physical, chart, labs and discussed the procedure including the risks, benefits and alternatives for the proposed anesthesia with the patient or authorized representative who has indicated his/her understanding and acceptance.   Dental advisory given  Plan Discussed with: CRNA  Anesthesia Plan Comments:        Anesthesia Quick Evaluation

## 2015-09-07 NOTE — Anesthesia Postprocedure Evaluation (Signed)
Anesthesia Post Note  Patient: Danny Herman  Procedure(s) Performed: Procedure(s) (LRB): LEFT TOTAL KNEE ARTHROPLASTY (Left)  Patient location during evaluation: PACU Anesthesia Type: Spinal, MAC and Regional Level of consciousness: awake and alert Pain management: pain level controlled Vital Signs Assessment: post-procedure vital signs reviewed and stable Respiratory status: spontaneous breathing and respiratory function stable Cardiovascular status: blood pressure returned to baseline and stable Postop Assessment: spinal receding Anesthetic complications: no    Last Vitals:  Vitals:   09/07/15 1410 09/07/15 1425  BP: 123/87 135/83  Pulse: 62 (!) 57  Resp: 18 18  Temp:      Last Pain:  Vitals:   09/07/15 0943  TempSrc: Oral  PainSc:                  Cherre Kothari,W. EDMOND

## 2015-09-08 ENCOUNTER — Encounter (HOSPITAL_COMMUNITY): Payer: Self-pay | Admitting: Orthopedic Surgery

## 2015-09-08 LAB — CBC
HEMATOCRIT: 37.7 % — AB (ref 39.0–52.0)
Hemoglobin: 12.4 g/dL — ABNORMAL LOW (ref 13.0–17.0)
MCH: 29.7 pg (ref 26.0–34.0)
MCHC: 32.9 g/dL (ref 30.0–36.0)
MCV: 90.2 fL (ref 78.0–100.0)
PLATELETS: 302 10*3/uL (ref 150–400)
RBC: 4.18 MIL/uL — AB (ref 4.22–5.81)
RDW: 12.6 % (ref 11.5–15.5)
WBC: 11.3 10*3/uL — AB (ref 4.0–10.5)

## 2015-09-08 LAB — BASIC METABOLIC PANEL
ANION GAP: 9 (ref 5–15)
BUN: 10 mg/dL (ref 6–20)
CO2: 28 mmol/L (ref 22–32)
Calcium: 9 mg/dL (ref 8.9–10.3)
Chloride: 100 mmol/L — ABNORMAL LOW (ref 101–111)
Creatinine, Ser: 1.02 mg/dL (ref 0.61–1.24)
GFR calc Af Amer: >60 mL/min (ref >60)
GLUCOSE: 104 mg/dL — AB (ref 65–99)
POTASSIUM: 3.5 mmol/L (ref 3.5–5.1)
Sodium: 137 mmol/L (ref 135–145)

## 2015-09-08 MED ORDER — METHOCARBAMOL 500 MG PO TABS: 500.0000 mg | ORAL_TABLET | Freq: Four times a day (QID) | ORAL | 0 refills | Status: AC | PRN

## 2015-09-08 MED ORDER — ASPIRIN 325 MG PO TBEC: 325.0000 mg | DELAYED_RELEASE_TABLET | Freq: Two times a day (BID) | ORAL | 0 refills | Status: AC

## 2015-09-08 MED ORDER — OXYCODONE HCL 5 MG PO TABS: 5.0000 mg | ORAL_TABLET | ORAL | 0 refills | Status: AC | PRN

## 2015-09-08 NOTE — Discharge Summary (Signed)
SPORTS MEDICINE & JOINT REPLACEMENT   Georgena Spurling, MD   Laurier Nancy, PA-C [address] [place], [place], [place]  [address]                             [phone]  PATIENT ID: Danny Herman        MRN:  [id number]          DOB/AGE: 18-May-1970 / 45 y.o.    DISCHARGE SUMMARY  ADMISSION DATE:    09/07/2015 DISCHARGE DATE:   09/08/2015   ADMISSION DIAGNOSIS: primary osteoarthritis left knee    DISCHARGE DIAGNOSIS:  primary osteoarthritis left knee    ADDITIONAL DIAGNOSIS: Active Problems:   S/P total knee replacement  Past Medical History:  Diagnosis Date  . Arthritis   . Hx of seasonal allergies     PROCEDURE: Procedure(s): LEFT TOTAL KNEE ARTHROPLASTY on 09/07/2015  CONSULTS:    HISTORY:  See H&P in chart  HOSPITAL COURSE:  Danny Herman is a 45 y.o. admitted on 09/07/2015 and found to have a diagnosis of primary osteoarthritis left knee.  After appropriate laboratory studies were obtained  they were taken to the operating room on 09/07/2015 and underwent Procedure(s): LEFT TOTAL KNEE ARTHROPLASTY.   They were given perioperative antibiotics:  Anti-infectives    Start     Dose/Rate Route Frequency Ordered Stop   09/07/15 1730  ceFAZolin (ANCEF) IVPB 1 g/50 mL premix     1 g 100 mL/hr over 30 Minutes Intravenous Every 6 hours 09/07/15 1623 09/08/15 0057   09/07/15 1000  ceFAZolin (ANCEF) IVPB 2g/100 mL premix     2 g 200 mL/hr over 30 Minutes Intravenous To ShortStay Surgical 09/06/15 0808 09/07/15 1135    .  Patient given tranexamic acid IV or topical and exparel intra-operatively.  Tolerated the procedure well.    POD# 1: Vital signs were stable.  Patient denied Chest pain, shortness of breath, or calf pain.  Patient was started on Lovenox 30 mg subcutaneously twice daily at 8am.  Consults to PT, OT, and care management were made.  The patient was weight bearing as tolerated.  CPM was placed on the operative leg 0-90 degrees for 6-8 hours a day. When out  of the CPM, patient was placed in the foam block to achieve full extension. Incentive spirometry was taught.  Dressing was changed.       POD #2, Continued  PT for ambulation and exercise program.  IV saline locked.  O2 discontinued.    The remainder of the hospital course was dedicated to ambulation and strengthening.   The patient was discharged on 1 Day Post-Op in  Good condition.  Blood products given:none  DIAGNOSTIC STUDIES: Recent vital signs: Patient Vitals for the past 24 hrs:  BP Temp Temp src Pulse Resp SpO2  09/08/15 0628 (!) 146/86 98.6 F (37 C) Oral 71 18 100 %  09/08/15 0020 125/69 98.4 F (36.9 C) Oral 65 - 100 %  09/07/15 1950 130/70 98.1 F (36.7 C) Oral 60 18 96 %  09/07/15 1717 (!) 149/85 98.6 F (37 C) Oral 60 20 100 %       Recent laboratory studies:  Recent Labs  09/08/15 0456  WBC 11.3*  HGB 12.4*  HCT 37.7*  PLT 302    Recent Labs  09/08/15 0456  NA 137  K 3.5  CL 100*  CO2 28  BUN 10  CREATININE 1.02  GLUCOSE  104*  CALCIUM 9.0   Lab Results  Component Value Date   INR 1.06 08/28/2015     Recent Radiographic Studies :  Dg Chest 2 View  Result Date: 08/28/2015 CLINICAL DATA:  Preoperative examination prior to knee surgery, no current chest complaints, current smoker. EXAM: CHEST  2 VIEW COMPARISON:  PA chest x-ray dated February 28, 2014 FINDINGS: The lungs are borderline hypoinflated. There is no focal infiltrate. There is no pleural effusion. The heart and pulmonary vascularity are normal. The mediastinum is normal in width. The trachea is midline. The bony thorax is unremarkable. IMPRESSION: There is no active cardiopulmonary disease. Electronically Signed   By: David  SwazilandJordan M.D.   On: 08/28/2015 13:25    DISCHARGE INSTRUCTIONS: Discharge Instructions    CPM    Complete by:  As directed   Continuous passive motion machine (CPM):      Use the CPM from 0 to 90 for 4-6 hours per day.      You may increase by 10 per day.  You may  break it up into 2 or 3 sessions per day.      Use CPM for 2 weeks or until you are told to stop.   Call MD / Call 911    Complete by:  As directed   If you experience chest pain or shortness of breath, CALL 911 and be transported to the hospital emergency room.  If you develope a fever above 101 F, pus (white drainage) or increased drainage or redness at the wound, or calf pain, call your surgeon's office.   Constipation Prevention    Complete by:  As directed   Drink plenty of fluids.  Prune juice may be helpful.  You may use a stool softener, such as Colace (over the counter) 100 mg twice a day.  Use MiraLax (over the counter) for constipation as needed.   Diet - low sodium heart healthy    Complete by:  As directed   Discharge instructions    Complete by:  As directed   INSTRUCTIONS AFTER JOINT REPLACEMENT   Remove items at home which could result in a fall. This includes throw rugs or furniture in walking pathways ICE to the affected joint every three hours while awake for 30 minutes at a time, for at least the first 3-5 days, and then as needed for pain and swelling.  Continue to use ice for pain and swelling. You may notice swelling that will progress down to the foot and ankle.  This is normal after surgery.  Elevate your leg when you are not up walking on it.   Continue to use the breathing machine you got in the hospital (incentive spirometer) which will help keep your temperature down.  It is common for your temperature to cycle up and down following surgery, especially at night when you are not up moving around and exerting yourself.  The breathing machine keeps your lungs expanded and your temperature down.   DIET:  As you were doing prior to hospitalization, we recommend a well-balanced diet.  DRESSING / WOUND CARE / SHOWERING  You may change your dressing 3-5 days after surgery.  Then change the dressing every day with sterile gauze.  Please use good hand washing techniques before  changing the dressing.  Do not use any lotions or creams on the incision until instructed by your surgeon.  ACTIVITY  Increase activity slowly as tolerated, but follow the weight bearing instructions below.   No driving  for 6 weeks or until further direction given by your physician.  You cannot drive while taking narcotics.  No lifting or carrying greater than 10 lbs. until further directed by your surgeon. Avoid periods of inactivity such as sitting longer than an hour when not asleep. This helps prevent blood clots.  You may return to work once you are authorized by your doctor.     WEIGHT BEARING   Weight bearing as tolerated with assist device (walker, cane, etc) as directed, use it as long as suggested by your surgeon or therapist, typically at least 4-6 weeks.   EXERCISES  Results after joint replacement surgery are often greatly improved when you follow the exercise, range of motion and muscle strengthening exercises prescribed by your doctor. Safety measures are also important to protect the joint from further injury. Any time any of these exercises cause you to have increased pain or swelling, decrease what you are doing until you are comfortable again and then slowly increase them. If you have problems or questions, call your caregiver or physical therapist for advice.   Rehabilitation is important following a joint replacement. After just a few days of immobilization, the muscles of the leg can become weakened and shrink (atrophy).  These exercises are designed to build up the tone and strength of the thigh and leg muscles and to improve motion. Often times heat used for twenty to thirty minutes before working out will loosen up your tissues and help with improving the range of motion but do not use heat for the first two weeks following surgery (sometimes heat can increase post-operative swelling).   These exercises can be done on a training (exercise) mat, on the floor, on a table  or on a bed. Use whatever works the best and is most comfortable for you.    Use music or television while you are exercising so that the exercises are a pleasant break in your day. This will make your life better with the exercises acting as a break in your routine that you can look forward to.   Perform all exercises about fifteen times, three times per day or as directed.  You should exercise both the operative leg and the other leg as well.   Exercises include:   Quad Sets - Tighten up the muscle on the front of the thigh (Quad) and hold for 5-10 seconds.   Straight Leg Raises - With your knee straight (if you were given a brace, keep it on), lift the leg to 60 degrees, hold for 3 seconds, and slowly lower the leg.  Perform this exercise against resistance later as your leg gets stronger.  Leg Slides: Lying on your back, slowly slide your foot toward your buttocks, bending your knee up off the floor (only go as far as is comfortable). Then slowly slide your foot back down until your leg is flat on the floor again.  Angel Wings: Lying on your back spread your legs to the side as far apart as you can without causing discomfort.  Hamstring Strength:  Lying on your back, push your heel against the floor with your leg straight by tightening up the muscles of your buttocks.  Repeat, but this time bend your knee to a comfortable angle, and push your heel against the floor.  You may put a pillow under the heel to make it more comfortable if necessary.   A rehabilitation program following joint replacement surgery can speed recovery and prevent re-injury in the future  due to weakened muscles. Contact your doctor or a physical therapist for more information on knee rehabilitation.    CONSTIPATION  Constipation is defined medically as fewer than three stools per week and severe constipation as less than one stool per week.  Even if you have a regular bowel pattern at home, your normal regimen is likely to be  disrupted due to multiple reasons following surgery.  Combination of anesthesia, postoperative narcotics, change in appetite and fluid intake all can affect your bowels.   YOU MUST use at least one of the following options; they are listed in order of increasing strength to get the job done.  They are all available over the counter, and you may need to use some, POSSIBLY even all of these options:    Drink plenty of fluids (prune juice may be helpful) and high fiber foods Colace 100 mg by mouth twice a day  Senokot for constipation as directed and as needed Dulcolax (bisacodyl), take with full glass of water  Miralax (polyethylene glycol) once or twice a day as needed.  If you have tried all these things and are unable to have a bowel movement in the first 3-4 days after surgery call either your surgeon or your primary doctor.    If you experience loose stools or diarrhea, hold the medications until you stool forms back up.  If your symptoms do not get better within 1 week or if they get worse, check with your doctor.  If you experience "the worst abdominal pain ever" or develop nausea or vomiting, please contact the office immediately for further recommendations for treatment.   ITCHING:  If you experience itching with your medications, try taking only a single pain pill, or even half a pain pill at a time.  You can also use Benadryl over the counter for itching or also to help with sleep.   TED HOSE STOCKINGS:  Use stockings on both legs until for at least 2 weeks or as directed by physician office. They may be removed at night for sleeping.  MEDICATIONS:  See your medication summary on the "After Visit Summary" that nursing will review with you.  You may have some home medications which will be placed on hold until you complete the course of blood thinner medication.  It is important for you to complete the blood thinner medication as prescribed.  PRECAUTIONS:  If you experience chest pain or  shortness of breath - call 911 immediately for transfer to the hospital emergency department.   If you develop a fever greater that 101 F, purulent drainage from wound, increased redness or drainage from wound, foul odor from the wound/dressing, or calf pain - CONTACT YOUR SURGEON.                                                   FOLLOW-UP APPOINTMENTS:  If you do not already have a post-op appointment, please call the office for an appointment to be seen by your surgeon.  Guidelines for how soon to be seen are listed in your "After Visit Summary", but are typically between 1-4 weeks after surgery.  OTHER INSTRUCTIONS:   Knee Replacement:  Do not place pillow under knee, focus on keeping the knee straight while resting. CPM instructions: 0-90 degrees, 2 hours in the morning, 2 hours in the afternoon, and 2  hours in the evening. Place foam block, curve side up under heel at all times except when in CPM or when walking.  DO NOT modify, tear, cut, or change the foam block in any way.  MAKE SURE YOU:  Understand these instructions.  Get help right away if you are not doing well or get worse.    Thank you for letting us be a part of your medical care team.  It is a privilege we respect greatly.  We hope these instructions will help you stay on track for a fast and full recovery!   Increase activity slowly as tolerated    Complete by:  As directed      DISCHARGE MEDICATIONS:     Medication List    TAKE these medications   aspirin 325 MG EC tablet Take 1 tablet (325 mg total) by mouth 2 (two) times daily.   FLUoxetine 10 MG tablet Commonly known as:  PROZAC Take 10 mg by mouth daily.   methocarbamol 500 MG tablet Commonly known as:  ROBAXIN Take 1-2 tablets (500-1,000 mg total) by mouth every 6 (six) hours as needed for muscle spasms.   multivitamin with minerals Tabs tablet Take 1 tablet by mouth daily.   oxyCODONE 5 MG immediate release tablet Commonly known as:  Oxy  IR/ROXICODONE Take 1-2 tablets (5-10 mg total) by mouth every 3 (three) hours as needed for breakthrough pain.       FOLLOW UP VISIT:    DISPOSITION: HOME VS. SNF  CONDITION:  Good   Guy Sandifer 09/08/2015, 4:55 PM

## 2015-09-08 NOTE — Progress Notes (Signed)
Physical Therapy Treatment Patient Details Name: Danny AltesMichael S Herman MRN: [id number] DOB: [birth year] Today's Date: 09/08/2015    History of Present Illness Pt is a 45 y.o. male s/p LEFT TOTAL KNEE ARTHROPLASTY. No pertinent PMHx in chart.     PT Comments    Patient progressing well towards PT goals. Provided exercise handout and instructed pt in there ex. Tolerated gait training with supervision for safety with cues for proper gait mechanics. Emphasized placement of LLE during sit to/from stand transfers to improve knee flexion. Reviewed car transfers and stepping into house on 1 step. Pt plans to discharge home with wife. Education re: exercise program for home, signs of DVT, ice usage etc.  Will follow if still in hospital.  Follow Up Recommendations  Home health PT;Supervision - Intermittent     Equipment Recommendations  Rolling walker with 5" wheels    Recommendations for Other Services       Precautions / Restrictions Precautions Precautions: Knee Precaution Booklet Issued: No Precaution Comments: Reviewed no pillow under knee and precautions. Restrictions Weight Bearing Restrictions: Yes LLE Weight Bearing: Weight bearing as tolerated    Mobility  Bed Mobility Overal bed mobility: Needs Assistance Bed Mobility: Supine to Sit;Sit to Supine     Supine to sit: Modified independent (Device/Increase time) Sit to supine: Modified independent (Device/Increase time)   General bed mobility comments: No assist needed.   Transfers Overall transfer level: Needs assistance Equipment used: Rolling walker (2 wheeled) Transfers: Sit to/from Stand Sit to Stand: Supervision         General transfer comment: Cues to place equal weight through BLEs upon standing and let left knee bend.  Ambulation/Gait Ambulation/Gait assistance: Supervision Ambulation Distance (Feet): 200 Feet Assistive device: Rolling walker (2 wheeled) Gait Pattern/deviations: Decreased stance time -  left;Decreased step length - right;Step-through pattern;Trunk flexed   Gait velocity interpretation: Below normal speed for age/gender General Gait Details: Cues for knee flexion during swing phase.    Stairs            Wheelchair Mobility    Modified Rankin (Stroke Patients Only)       Balance Overall balance assessment: Needs assistance Sitting-balance support: Feet supported;No upper extremity supported Sitting balance-Leahy Scale: Good     Standing balance support: During functional activity Standing balance-Leahy Scale: Fair                      Cognition Arousal/Alertness: Awake/alert Behavior During Therapy: WFL for tasks assessed/performed Overall Cognitive Status: Within Functional Limits for tasks assessed                      Exercises Total Joint Exercises Quad Sets: Both;10 reps;Supine Towel Squeeze: Both;10 reps;Supine Heel Slides: Left;10 reps;Supine Hip ABduction/ADduction: Left;10 reps;Supine Straight Leg Raises: Left;10 reps;Supine Long Arc Quad: Left;5 reps;Seated Other Exercises Other Exercises: sitting EOB with left knee hanging down using gravity to increase knee flexion x6 minutes.    General Comments General comments (skin integrity, edema, etc.): Wife present during session.      Pertinent Vitals/Pain Pain Assessment: 0-10 Pain Score: 5  Pain Location: left knee Pain Descriptors / Indicators: Operative site guarding;Sore Pain Intervention(s): Monitored during session;Premedicated before session;Repositioned;Ice applied    Home Living                      Prior Function            PT Goals (current goals can now be  found in the care plan section) Progress towards PT goals: Progressing toward goals    Frequency  7X/week    PT Plan Current plan remains appropriate    Co-evaluation             End of Session   Activity Tolerance: Patient tolerated treatment well Patient left: in bed;with  call bell/phone within reach;with family/visitor present     Time: 1608-1641 PT Time [id number] (min) (ACUTE ONLY): 33 min  Charges:  $Gait Training: 8-22 mins $Therapeutic Exercise: 8-22 mins                    G Codes:      Danny Herman 09/08/2015, 4:44 PM Danny RedShauna Shonta Herman, PT, DPT [phone]

## 2015-09-08 NOTE — Evaluation (Signed)
Physical Therapy Evaluation Patient Details Name: Danny AltesMichael S Hallmark MRN: [id number] DOB: 1970/10/11 Today's Date: 09/08/2015   History of Present Illness  Pt is a 45 y.o. male s/p LEFT TOTAL KNEE ARTHROPLASTY. No pertinent PMHx in chart.   Clinical Impression  Patient presents with pain and post surgical deficits LLE s/p left TKA. Tolerated gait training and transfers with Min guard-supervision for safety. Instructed pt in exercises and reviewed precautions, positioning, zero degree knee. Pt plans to discharge home later today after PM session. Will focus on gait training and provide handout of exercises this afternoon. Pt with decreased AROM overall. Will continue to follow to maximize independence.     Follow Up Recommendations Home health PT;Supervision - Intermittent    Equipment Recommendations  Rolling walker with 5" wheels    Recommendations for Other Services       Precautions / Restrictions Precautions Precautions: Fall;Knee Precaution Comments: Reviewed no pillow under knee and precautions. Restrictions Weight Bearing Restrictions: Yes LLE Weight Bearing: Weight bearing as tolerated      Mobility  Bed Mobility Overal bed mobility: Needs Assistance Bed Mobility: Supine to Sit     Supine to sit: Supervision;HOB elevated     General bed mobility comments: up in chair upon PT arrival.   Transfers Overall transfer level: Needs assistance Equipment used: Rolling walker (2 wheeled) Transfers: Sit to/from Stand Sit to Stand: Supervision         General transfer comment: Supervision for safety; no physical assist needed. Good hand placement with RW.  Ambulation/Gait Ambulation/Gait assistance: Min guard Ambulation Distance (Feet): 120 Feet Assistive device: Rolling walker (2 wheeled) Gait Pattern/deviations: Decreased stance time - left;Step-through pattern;Trunk flexed   Gait velocity interpretation: Below normal speed for age/gender General Gait Details:  Cues for knee extension durnig stance phase and knee flexion during swing phase. Increased WB through BUEs.  Stairs            Wheelchair Mobility    Modified Rankin (Stroke Patients Only)       Balance Overall balance assessment: Needs assistance Sitting-balance support: Feet supported;No upper extremity supported Sitting balance-Leahy Scale: Good     Standing balance support: During functional activity Standing balance-Leahy Scale: Fair Standing balance comment: Able to stand statically without UE support.                             Pertinent Vitals/Pain Pain Assessment: Faces Faces Pain Scale: Hurts even more Pain Location: left knee with mobility Pain Descriptors / Indicators: Operative site guarding;Sore;Guarding Pain Intervention(s): Monitored during session;Premedicated before session;Repositioned;Ice applied    Home Living Family/patient expects to be discharged to:: Private residence Living Arrangements: Spouse/significant other Available Help at Discharge: Family;Available 24 hours/day Type of Home: House Home Access: Stairs to enter   Entergy CorporationEntrance Stairs-Number of Steps: 1 Home Layout: One level Home Equipment: None      Prior Function Level of Independence: Independent               Hand Dominance        Extremity/Trunk Assessment   Upper Extremity Assessment: Defer to OT evaluation           Lower Extremity Assessment: LLE deficits/detail   LLE Deficits / Details: Limited knee AROM/strength secondary to pain/post op  Cervical / Trunk Assessment: Normal  Communication   Communication: No difficulties  Cognition Arousal/Alertness: Awake/alert Behavior During Therapy: WFL for tasks assessed/performed Overall Cognitive Status: Within Functional Limits for tasks  assessed                      General Comments      Exercises Total Joint Exercises Ankle Circles/Pumps: Both;10 reps;Supine Quad Sets: Both;10  reps;Supine Heel Slides: Left;10 reps;Seated Hip ABduction/ADduction: Left;10 reps;Seated Long Arc Quad: Left;10 reps;Seated Goniometric ROM: 10-65 degrees knee AROM      Assessment/Plan    PT Assessment Patient needs continued PT services  PT Diagnosis Difficulty walking;Acute pain   PT Problem List Decreased strength;Decreased knowledge of precautions;Decreased range of motion;Pain;Decreased balance;Decreased knowledge of use of DME  PT Treatment Interventions DME instruction;Therapeutic activities;Gait training;Therapeutic exercise;Balance training;Patient/family education;Stair training   PT Goals (Current goals can be found in the Care Plan section) Acute Rehab PT Goals Patient Stated Goal: to go home today PT Goal Formulation: With patient Time For Goal Achievement: 09/22/15 Potential to Achieve Goals: Good    Frequency 7X/week   Barriers to discharge        Co-evaluation               End of Session Equipment Utilized During Treatment: Gait belt Activity Tolerance: Patient tolerated treatment well Patient left: in chair;with call bell/phone within reach Nurse Communication: Mobility status         Time: [id number] PT Time Calculation (min) (ACUTE ONLY): 23 min   Charges:   PT Evaluation $PT Eval Low Complexity: 1 Procedure PT Treatments $Gait Training: 8-22 mins   PT G Codes:        Emeterio Balke A Jaecion Dempster 09/08/2015, 12:17 PM ,Mylo RedShauna Gillis Boardley, PT, DPT [phone]

## 2015-09-08 NOTE — Op Note (Signed)
TOTAL KNEE REPLACEMENT OPERATIVE NOTE:  09/07/2015  12:53 PM  PATIENT:  Danny Herman  45 y.o. male  PRE-OPERATIVE DIAGNOSIS:  primary osteoarthritis left knee  POST-OPERATIVE DIAGNOSIS:  primary osteoarthritis left knee  PROCEDURE:  Procedure(s): LEFT TOTAL KNEE ARTHROPLASTY  SURGEON:  Surgeon(s): Dannielle HuhSteve Jaequan Propes, MD  PHYSICIAN ASSISTANT: Laurier Nancy{Colby Robbins, [facility]   ANESTHESIA:   spinal  DRAINS: Hemovac  SPECIMEN: None  COUNTS:  Correct  TOURNIQUET:   Total Tourniquet Time Documented: Thigh (Left) - 60 minutes Total: Thigh (Left) - 60 minutes   DICTATION:  Indication for procedure:    The patient is a 45 y.o. male who has failed conservative treatment for primary osteoarthritis left knee.  Informed consent was obtained prior to anesthesia. The risks versus benefits of the operation were explain and in a way the patient can, and did, understand.   On the implant demand matching protocol, this patient scored 10.  Therefore, this patient did" "did not receive a polyethylene insert with vitamin E which is a high demand implant.  Description of procedure:     The patient was taken to the operating room and placed under anesthesia.  The patient was positioned in the usual fashion taking care that all body parts were adequately padded and/or protected.  I foley catheter was not placed.  A tourniquet was applied and the leg prepped and draped in the usual sterile fashion.  The extremity was exsanguinated with the esmarch and tourniquet inflated to 350 mmHg.  Pre-operative range of motion was normal.  The knee was in 5 degree of mild varus.  A midline incision approximately 6-7 inches long was made with a #10 blade.  A new blade was used to make a parapatellar arthrotomy going 2-3 cm into the quadriceps tendon, over the patella, and alongside the medial aspect of the patellar tendon.  A synovectomy was then performed with the #10 blade and forceps. I then elevated the deep MCL off the  medial tibial metaphysis subperiosteally around to the semimembranosus attachment.    I everted the patella and used calipers to measure patellar thickness.  I used the reamer to ream down to appropriate thickness to recreate the native thickness.  I then removed excess bone with the rongeur and sagittal saw.  I used the appropriately sized template and drilled the three lug holes.  I then put the trial in place and measured the thickness with the calipers to ensure recreation of the native thickness.  The trial was then removed and the patella subluxed and the knee brought into flexion.  A homan retractor was place to retract and protect the patella and lateral structures.  A Z-retractor was place medially to protect the medial structures.  The extra-medullary alignment system was used to make cut the tibial articular surface perpendicular to the anamotic axis of the tibia and in 3 degrees of posterior slope.  The cut surface and alignment jig was removed.  I then used the intramedullary alignment guide to make a 6 valgus cut on the distal femur.  I then marked out the epicondylar axis on the distal femur.  The posterior condylar axis measured 3 degrees.  I then used the anterior referencing sizer and measured the femur to be a size 10.  The 4-In-1 cutting block was screwed into place in external rotation matching the posterior condylar angle, making our cuts perpendicular to the epicondylar axis.  Anterior, posterior and chamfer cuts were made with the sagittal saw.  The cutting block and  cut pieces were removed.  A lamina spreader was placed in 90 degrees of flexion.  The ACL, PCL, menisci, and posterior condylar osteophytes were removed.  A 16 mm spacer blocked was found to offer good flexion and extension gap balance after moderate in degree releasing.   The scoop retractor was then placed and the femoral finishing block was pinned in place.  The small sagittal saw was used as well as the lug drill to  finish the femur.  The block and cut surfaces were removed and the medullary canal hole filled with autograft bone from the cut pieces.  The tibia was delivered forward in deep flexion and external rotation.  A size F tray was selected and pinned into place centered on the medial 1/3 of the tibial tubercle.  The reamer and keel was used to prepare the tibia through the tray.    I then trialed with the size 10 femur, size F tibia, a 16 mm insert and the 35 patella.  I had excellent flexion/extension gap balance, excellent patella tracking.  Flexion was full and beyond 120 degrees; extension was zero.  These components were chosen and the staff opened them to me on the back table while the knee was lavaged copiously and the cement mixed.  The soft tissue was infiltrated with 60cc of exparel 1.3% through a 21 gauge needle.  I cemented in the components and removed all excess cement.  The polyethylene tibial component was snapped into place and the knee placed in extension while cement was hardening.  The capsule was infilltrated with 30cc of .25% Marcaine with epinephrine.  A hemovac was place in the joint exiting superolaterally.  A pain pump was place superomedially superficial to the arthrotomy.  Once the cement was hard, the tourniquet was let down.  Hemostasis was obtained.  The arthrotomy was closed with figure-8 #1 vicryl sutures.  The deep soft tissues were closed with #0 vicryls and the subcuticular layer closed with a running #2-0 vicryl.  The skin was reapproximated and closed with skin staples.  The wound was dressed with xeroform, 4 x4's, 2 ABD sponges, a single layer of webril and a TED stocking.   The patient was then awakened, extubated, and taken to the recovery room in stable condition.  BLOOD LOSS:  300cc DRAINS: 1 hemovac, 1 pain catheter COMPLICATIONS:  None.  PLAN OF CARE: Admit to inpatient   PATIENT DISPOSITION:  PACU - hemodynamically stable.   Delay start of Pharmacological  VTE agent (>24hrs) due to surgical blood loss or risk of bleeding:  not applicable  Please fax a copy of this op note to my office at [phone] (please only include page 1 and 2 of the Case Information op note)

## 2015-09-08 NOTE — Progress Notes (Signed)
SPORTS MEDICINE AND JOINT REPLACEMENT  Georgena SpurlingStephen Lucey, MD    Laurier Nancyolby Hermena Swint, PA-C [address], [place], [place]  [id number]                             [phone]   PROGRESS NOTE  Subjective:  negative for Chest Pain  negative for Shortness of Breath  negative for Nausea/Vomiting   negative for Calf Pain  negative for Bowel Movement   Tolerating Diet: yes         Patient reports pain as 5 on 0-10 scale.    Objective: Vital signs in last 24 hours:   Patient Vitals for the past 24 hrs:  BP Temp Temp src Pulse Resp SpO2 Height Weight  09/08/15 0628 (!) 146/86 98.6 F (37 C) Oral 71 18 100 % - -  09/08/15 0020 125/69 98.4 F (36.9 C) Oral 65 - 100 % - -  09/07/15 1950 130/70 98.1 F (36.7 C) Oral 60 18 96 % - -  09/07/15 1717 (!) 149/85 98.6 F (37 C) Oral 60 20 100 % - -  09/07/15 1550 - 98.2 F (36.8 C) - - - - - -  09/07/15 1540 139/83 - - (!) 56 20 98 % - -  09/07/15 1525 125/86 - - (!) 51 12 98 % - -  09/07/15 1510 110/70 - - 65 15 98 % - -  09/07/15 1455 136/87 - - (!) 55 12 97 % - -  09/07/15 1440 (!) 136/92 - - (!) 57 19 98 % - -  09/07/15 1425 135/83 - - (!) 57 18 98 % - -  09/07/15 1410 123/87 - - 62 18 98 % - -  09/07/15 1355 110/77 - - (!) 50 16 100 % - -  09/07/15 1345 114/66 97.9 F (36.6 C) - (!) 50 10 100 % - -  09/07/15 1010 (!) 147/77 - - 64 10 96 % - -  09/07/15 1005 (!) 152/88 - - 65 11 96 % - -  09/07/15 1000 (!) 148/81 - - 66 10 95 % - -  09/07/15 0955 (!) 163/85 - - 65 12 98 % - -  09/07/15 0943 (!) 171/91 98.5 F (36.9 C) Oral 84 18 98 % 5\' 10"  (1.778 m) 99.3 kg (219 lb)    @flow {1959:LAST@   Intake/Output from previous day:   08/21 0701 - 08/22 0700 In: 2097.5 [P.O.:360; I.V.:1737.5] Out: 1810 [Urine:1800]   Intake/Output this shift:   No intake/output data recorded.   Intake/Output      08/21 0701 - 08/22 0700 08/22 0701 - 08/23 0700   P.O. 360    I.V. (mL/kg) 1737.5 (17.5)    Total Intake(mL/kg) 2097.5 (21.1)    Urine  (mL/kg/hr) 1800    Blood 10    Total Output 1810     Net +287.5          Urine Occurrence 1 x       LABORATORY DATA:  Recent Labs  09/08/15 0456  WBC 11.3*  HGB 12.4*  HCT 37.7*  PLT 302    Recent Labs  09/08/15 0456  NA 137  K 3.5  CL 100*  CO2 28  BUN 10  CREATININE 1.02  GLUCOSE 104*  CALCIUM 9.0   Lab Results  Component Value Date   INR 1.06 08/28/2015    Examination:  General appearance: alert, cooperative and no distress Extremities: extremities normal, atraumatic, no  cyanosis or edema  Wound Exam: clean, dry, intact   Drainage:  None: wound tissue dry  Motor Exam: Quadriceps and Hamstrings Intact  Sensory Exam: Superficial Peroneal, Deep Peroneal and Tibial normal   Assessment:    1 Day Post-Op  Procedure(s) (LRB): LEFT TOTAL KNEE ARTHROPLASTY (Left)  ADDITIONAL DIAGNOSIS:  Active Problems:   S/P total knee replacement  Acute Blood Loss Anemia   Plan: Physical Therapy as ordered Weight Bearing as Tolerated (WBAT)  DVT Prophylaxis:  Aspirin  DISCHARGE PLAN: Home  DISCHARGE NEEDS: HHPT   Patient doing great, expected D/C today         Guy SandiferColby Alan Adrean Heitz 09/08/2015, 7:20 AM

## 2015-09-08 NOTE — Progress Notes (Addendum)
Orthopedic Tech Progress Note Patient Details:  Danny AltesMichael S Herman Oct 06, 1970 [id number]  Patient ID: Danny Herman, male   DOB: Oct 06, 1970, 45 y.o.   MRN: [id number] Applied cpm 0-45 due to pain.  Trinna PostMartinez, Marelyn Rouser J 09/08/2015, 6:44 AM

## 2015-09-08 NOTE — Evaluation (Signed)
Occupational Therapy Evaluation and Discharge Patient Details Name: Danny AltesMichael S Schreck MRN: [id number] DOB: 1970-04-18 Today's Date: 09/08/2015    History of Present Illness Pt is a 45 y.o. male s/p LEFT TOTAL KNEE ARTHROPLASTY. No pertinent PMHx in chart.    Clinical Impression   Pt reports he was independent with ADL PTA. Currently pt is overall supervision for safety with ADL and functional mobility. All safety and ADL education complete; pt with no further questions or concerns for OT at this time. Pt planning to d/c home with 24/7 supervision from family. No further acute OT needs identified; signing off at this time. Please re-consult if needs change. Thank you for this referral.    Follow Up Recommendations  No OT follow up;Supervision/Assistance - 24 hour    Equipment Recommendations  None recommended by OT    Recommendations for Other Services PT consult     Precautions / Restrictions Precautions Precautions: Fall Precaution Comments: Educated pt on use of zero degree knee.  Restrictions Weight Bearing Restrictions: Yes LLE Weight Bearing: Weight bearing as tolerated      Mobility Bed Mobility Overal bed mobility: Needs Assistance Bed Mobility: Supine to Sit     Supine to sit: Supervision;HOB elevated     General bed mobility comments: Pt reports he has a bed that HOB elevates. Able to perform all bed mobility with supervision for safety.  Transfers Overall transfer level: Needs assistance Equipment used: Rolling walker (2 wheeled) Transfers: Sit to/from Stand Sit to Stand: Supervision         General transfer comment: Supervision for safety; no physical assist needed. Good hand placement with RW.    Balance Overall balance assessment: Needs assistance Sitting-balance support: Feet supported;No upper extremity supported Sitting balance-Leahy Scale: Good     Standing balance support: No upper extremity supported;During functional activity Standing  balance-Leahy Scale: Good                              ADL Overall ADL's : Needs assistance/impaired Eating/Feeding: Modified independent;Sitting   Grooming: Supervision/safety;Standing   Upper Body Bathing: Set up;Sitting   Lower Body Bathing: Supervison/ safety;Sit to/from stand   Upper Body Dressing : Set up;Sitting Upper Body Dressing Details (indicate cue type and reason): to don shirt Lower Body Dressing: Set up;Supervision/safety;Sit to/from stand Lower Body Dressing Details (indicate cue type and reason): to don bil socks. Educated pt on compensatory strategies for LB ADL. Toilet Transfer: Supervision/safety;Ambulation;Regular Toilet;RW Toilet Transfer Details (indicate cue type and reason): Simulated by sit to stand from EOB with functional mobility in room. Pt reports he has been managing toilet well since hospital admission; no need for 3 in 1. Toileting- Clothing Manipulation and Hygiene: Supervision/safety;Sit to/from stand   Tub/ Shower Transfer: Supervision/safety;Walk-in shower;Ambulation;Rolling walker Tub/Shower Transfer Details (indicate cue type and reason): Educated pt on walk in shower transfer technique; he was able to return demo with supervision for safety. Functional mobility during ADLs: Supervision/safety;Rolling walker General ADL Comments: Educated pt on proper positioning of LLE, ice for edema and pain. Encouraged movement every hour to reduce stiffness.     Vision Vision Assessment?: No apparent visual deficits   Perception     Praxis      Pertinent Vitals/Pain Pain Assessment: Faces Faces Pain Scale: Hurts even more Pain Location: L knee Pain Descriptors / Indicators: Grimacing;Sore;Operative site guarding Pain Intervention(s): Monitored during session;Repositioned;Ice applied     Hand Dominance     Extremity/Trunk Assessment  Upper Extremity Assessment Upper Extremity Assessment: Overall WFL for tasks assessed   Lower  Extremity Assessment Lower Extremity Assessment: Defer to PT evaluation   Cervical / Trunk Assessment Cervical / Trunk Assessment: Normal   Communication Communication Communication: No difficulties   Cognition Arousal/Alertness: Awake/alert Behavior During Therapy: WFL for tasks assessed/performed Overall Cognitive Status: Within Functional Limits for tasks assessed                     General Comments       Exercises       Shoulder Instructions      Home Living Family/patient expects to be discharged to:: Private residence Living Arrangements: Spouse/significant other Available Help at Discharge: Family;Available 24 hours/day Type of Home: House Home Access: Stairs to enter Enter[employer] CorporationEntrance Stairs-Number of Steps: 1   Home Layout: One level     Bathroom Shower/Tub: Producer, television/film/videoWalk-in shower   Bathroom Toilet: Standard     Home Equipment: None          Prior Functioning/Environment Level of Independence: Independent             OT Diagnosis: Acute pain   OT Problem List:     OT Treatment/Interventions:      OT Goals(Current goals can be found in the care plan section) Acute Rehab OT Goals Patient Stated Goal: home today OT Goal Formulation: All assessment and education complete, DC therapy  OT Frequency:     Barriers to D/C:            Co-evaluation              End of Session Equipment Utilized During Treatment: Rolling walker CPM Left Knee CPM Left Knee: Off Nurse Communication: Mobility status  Activity Tolerance: Patient tolerated treatment well Patient left: in chair;with call bell/phone within reach   Time: 1038-1050 OT Time Calculation (min): 12 min Charges:  OT General Charges $OT Visit: 1 Procedure OT Evaluation $OT Eval Low Complexity: 1 Procedure G-Codes:     Gaye AlkenBailey A Maleak Brazzel M.S., OTR/L Pager: [phone]  09/08/2015, 11:07 AM

## 2016-02-03 IMAGING — MR MR KNEE*L* W/O CM
6 series · 40 of 40 positions shown · non-contrast
Comparison: Pop 14 15

CLINICAL DATA: Pretty left knee pain began 6 days ago. No known
injury. Prior surgery in 7152.

EXAM:
MRI OF THE LEFT KNEE WITHOUT CONTRAST
TECHNIQUE: Multiplanar, multisequence MR imaging of the knee was performed. No
intravenous contrast was administered.

[Series 3: PD · axial · 4.0mm · 0.50mm/px · z∈[-62,+48]mm · 8 of 24 slices shown (1 of 2)]
[im 1/24]
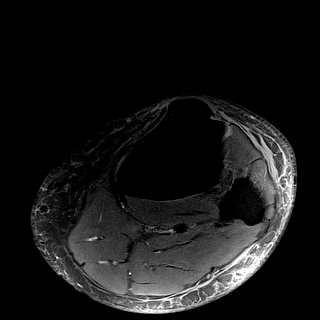
[im 4/24]
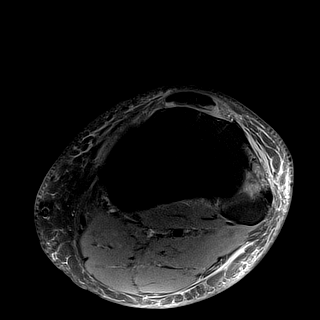
[im 7/24]
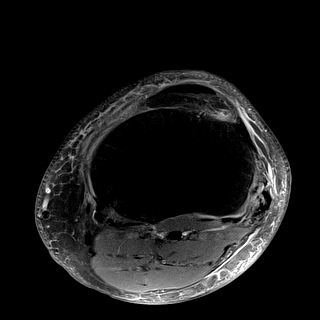
[im 10/24]
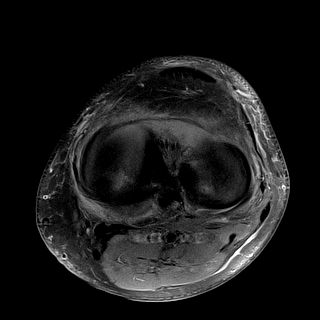
[im 14/24]
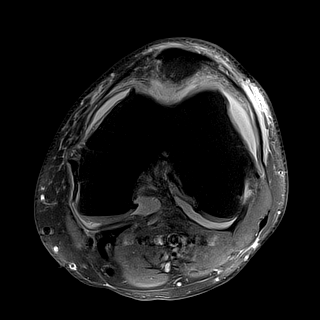
[im 17/24]
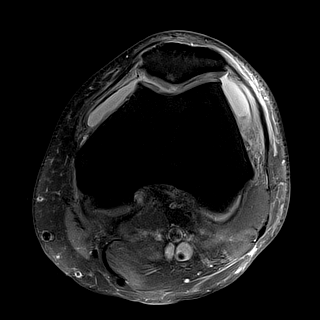
[im 20/24]
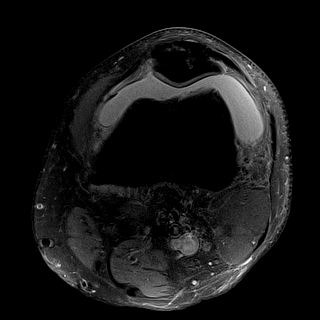
[im 24/24]
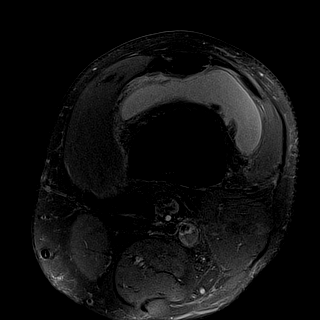

[Series 4: PD · coronal · 4.0mm · 0.53mm/px · 6 of 20 slices shown (2 of 2)]
[im 1/20]
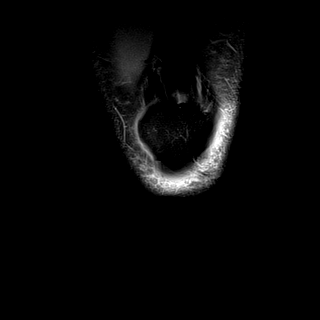
[im 4/20]
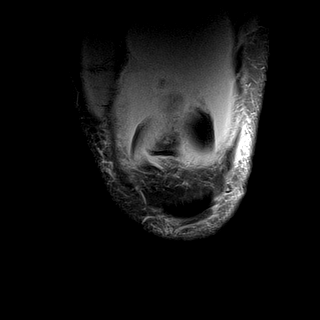
[im 8/20]
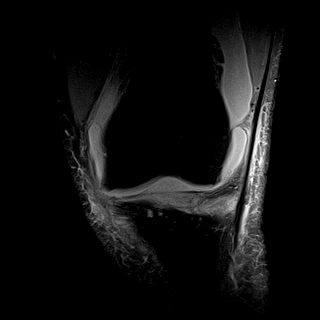
[im 12/20]
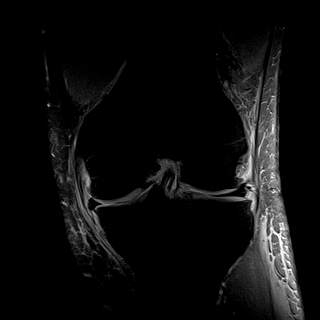
[im 16/20]
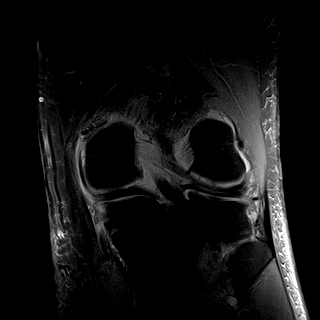
[im 20/20]
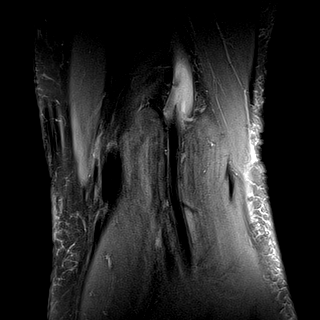

[Series 5: (id) · coronal · 4.0mm · 0.53mm/px · 6 of 20 slices shown]
[im 1/20]
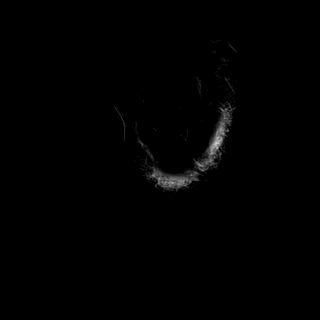
[im 4/20]
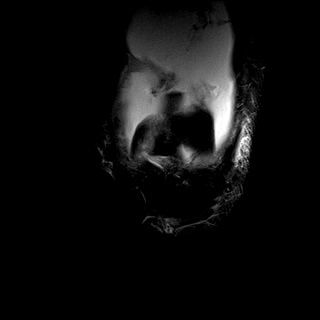
[im 8/20]
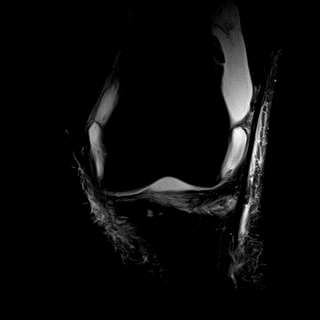
[im 12/20]
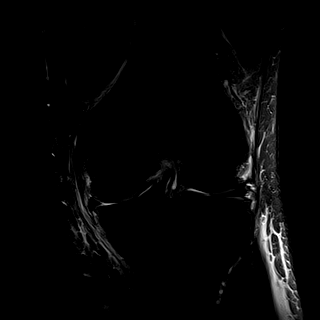
[im 16/20]
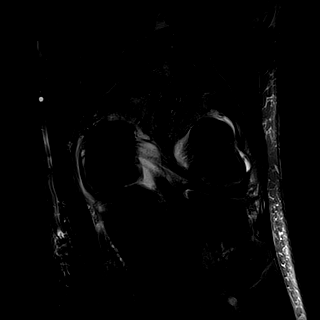
[im 20/20]
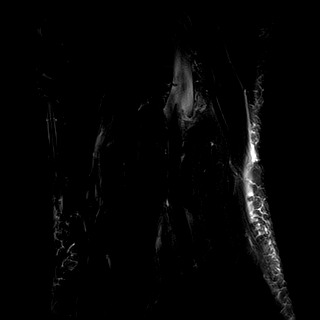

[Series 6: PD fat-sat · sagittal · 4.0mm · 0.50mm/px · 8 of 24 slices shown]
[im 1/24]
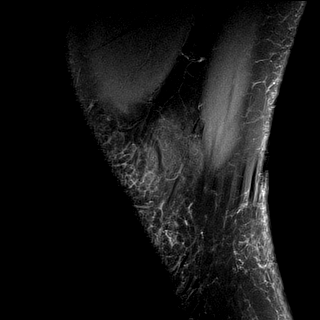
[im 4/24]
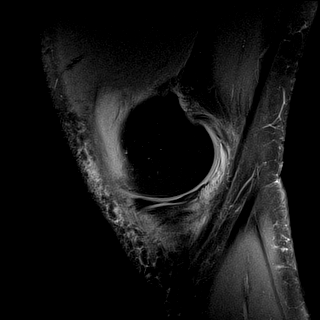
[im 7/24]
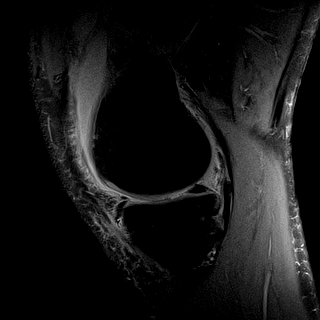
[im 10/24]
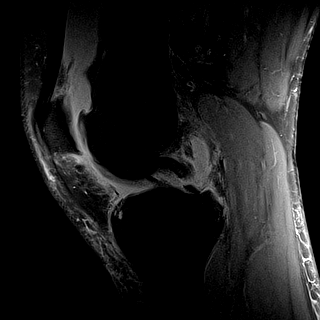
[im 14/24]
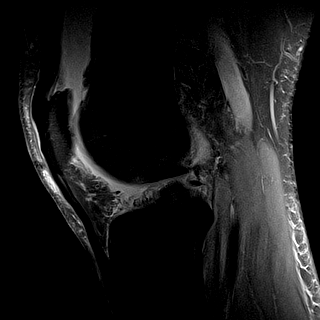
[im 17/24]
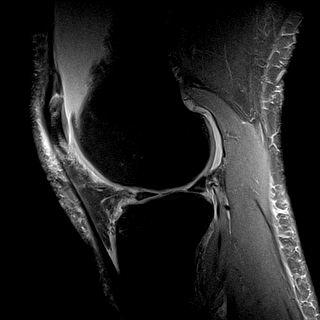
[im 20/24]
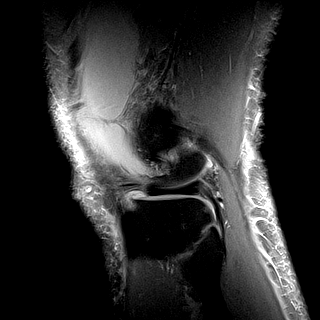
[im 24/24]
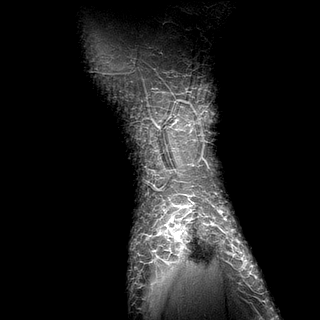

[Series 7: T1 · coronal · 4.0mm · 0.53mm/px · 6 of 20 slices shown]
[im 1/20]
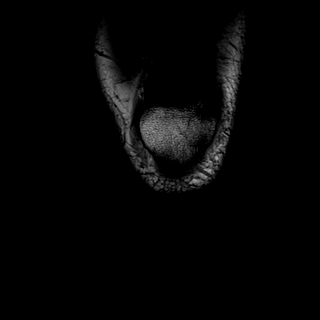
[im 4/20]
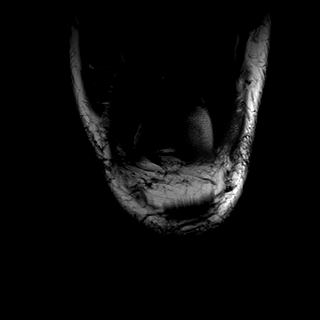
[im 8/20]
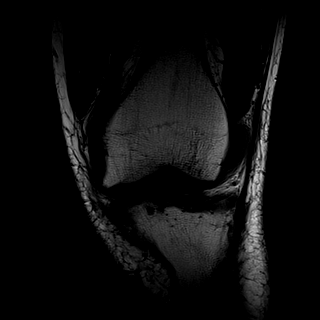
[im 12/20]
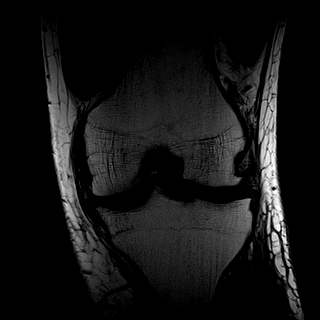
[im 16/20]
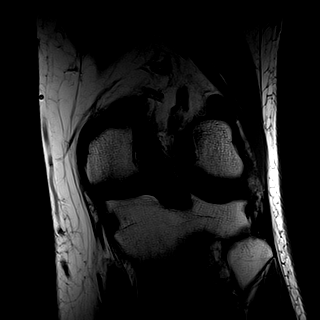
[im 20/20]
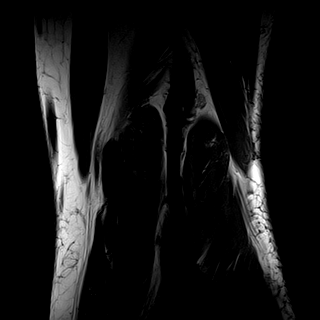

[Series 8: cor acl 2mm · oblique · 2.0mm · 0.59mm/px · 6 of 20 slices shown]
[im 1/20]
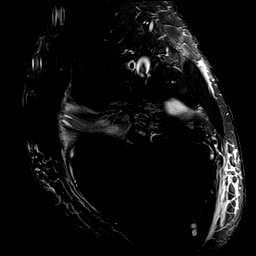
[im 4/20]
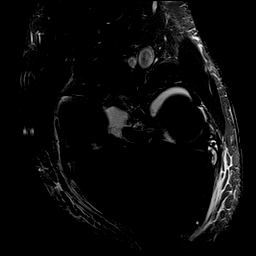
[im 8/20]
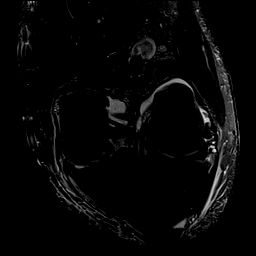
[im 12/20]
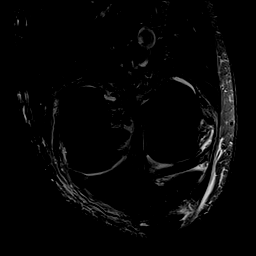
[im 16/20]
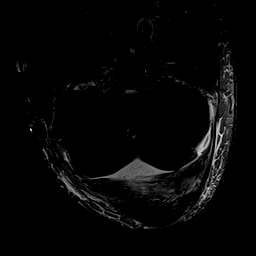
[im 20/20]
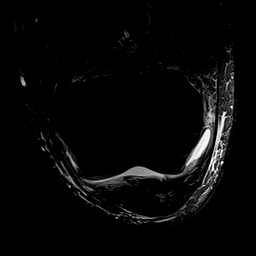

[40 of 40 positions shown; findings below may reference images not displayed]

FINDINGS: MENISCI

Medial meniscus:  Intact.

Lateral meniscus: Intact. Mild increased signal in the posterior
horn of the lateral meniscus likely reflecting degeneration.

LIGAMENTS

Cruciates:  Intact ACL and PCL.

Collaterals: Intact lateral collateral ligament complex. Mild
thickening of the MCL without disruption.

CARTILAGE

Patellofemoral: High-grade partial-thickness cartilage loss of the
patellar apex with mild subchondral reactive marrow change.
Full-thickness cartilage loss of the trochlear groove.

Medial: High-grade partial-thickness cartilage loss with areas of
full-thickness cartilage loss involving the medial femoral condyle
and medial tibial plateau with marginal osteophytosis and mild
subchondral reactive marrow change along the periphery of the medial
tibial plateau.

Lateral: Focal full-thickness cartilage defect measuring 7 mm
involving the weight-bearing aspect of the lateral femoral condyles.

Joint: Large joint effusion. Mild edema in Hoffa's fat. No plical
thickening.

Popliteal Fossa:  No Baker's cyst.  Intact popliteus tendon.

Extensor Mechanism:  Intact.

Bones: No focal marrow signal abnormality. No fracture or
dislocation.
IMPRESSION: 1. Tricompartmental cartilage abnormalities involving the left knee
as described above. There has been significant progression of
cartilage loss involving the medial femorotibial compartment.
2. Large joint effusion.

## 2017-05-24 IMAGING — DX DG CHEST 2V
2 series · 2 of 2 positions shown · non-contrast
Comparison: PA chest x-ray dated February 28, 2014

CLINICAL DATA: Preoperative examination prior to knee surgery, no
current chest complaints, current smoker.

EXAM:
CHEST  2 VIEW

[chest pa]
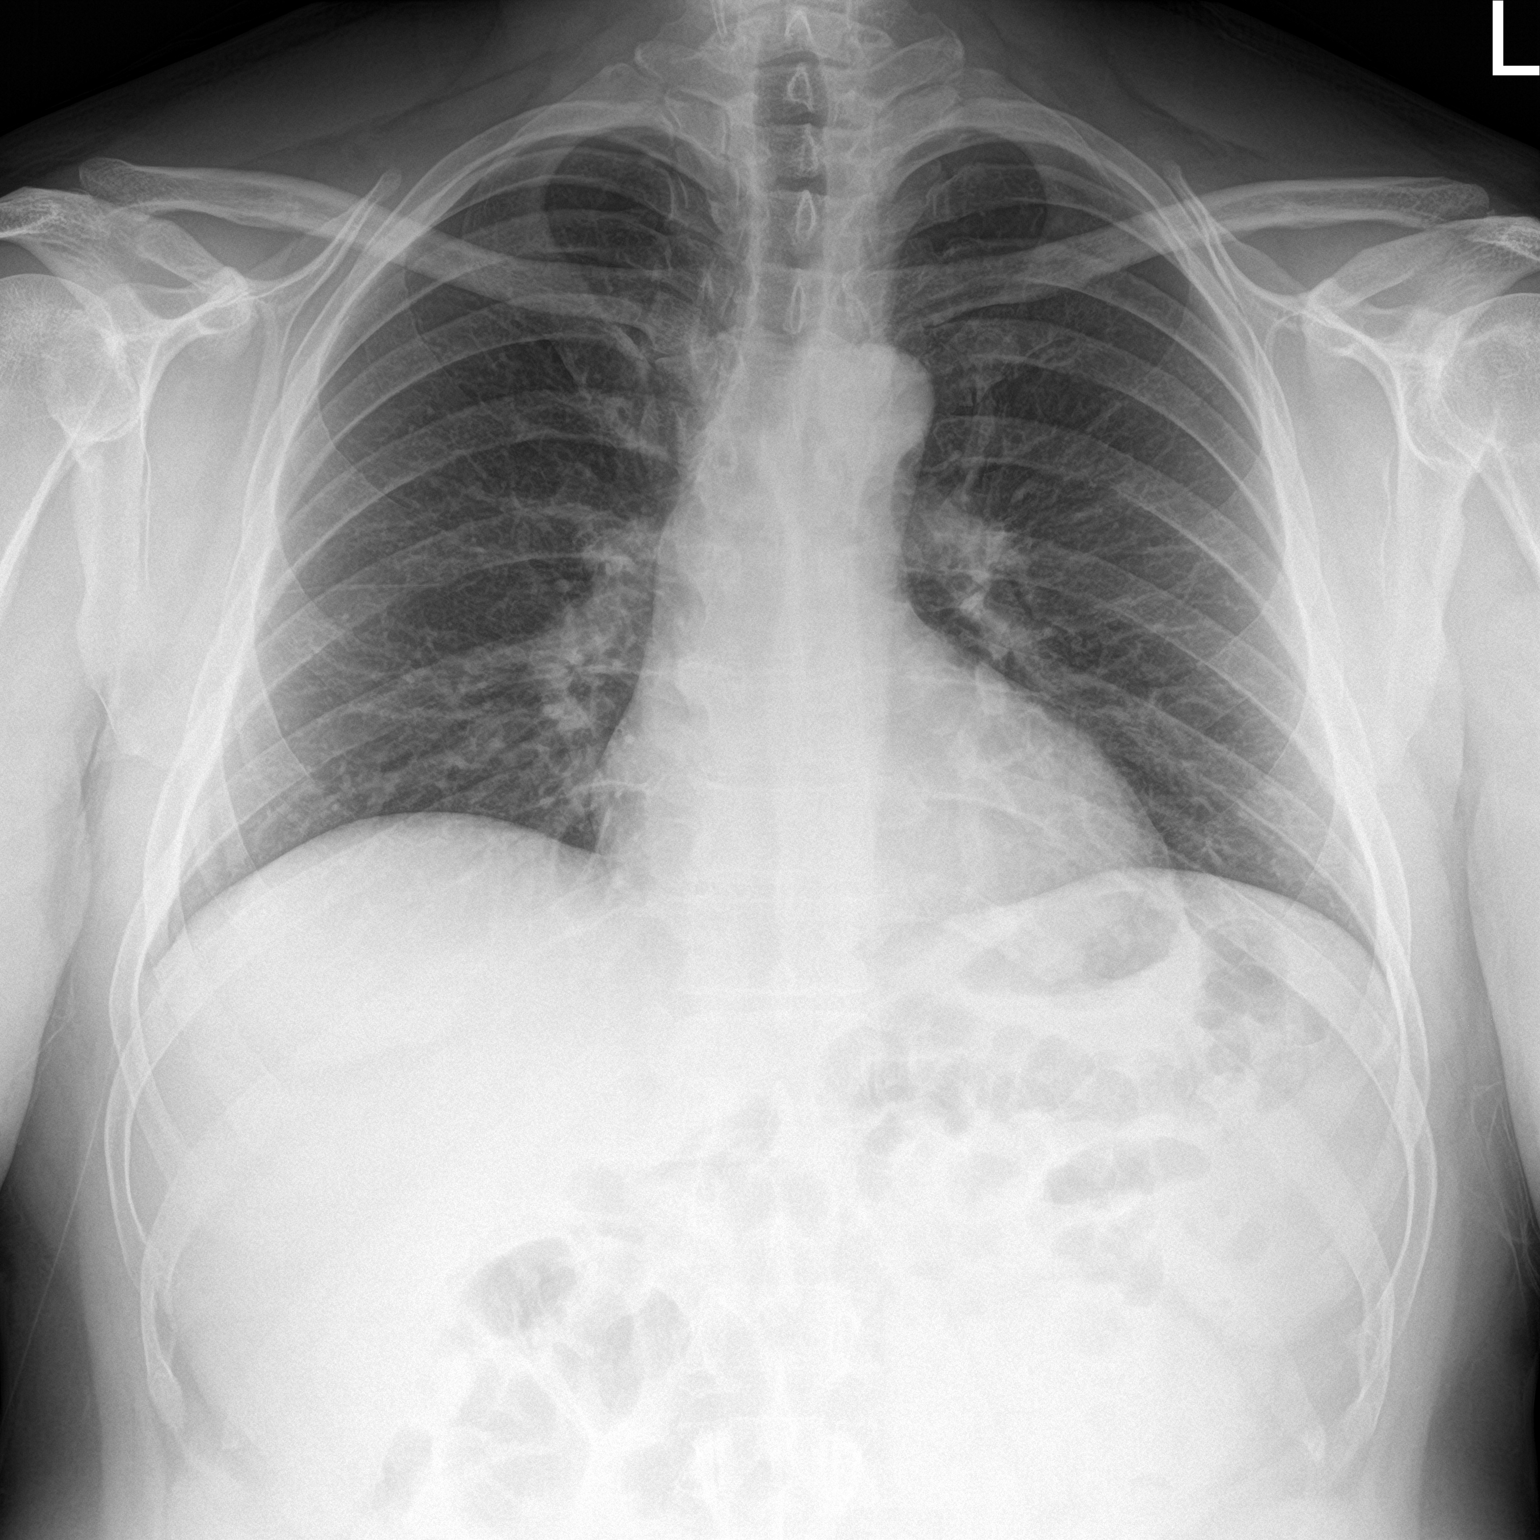

[chest lat]
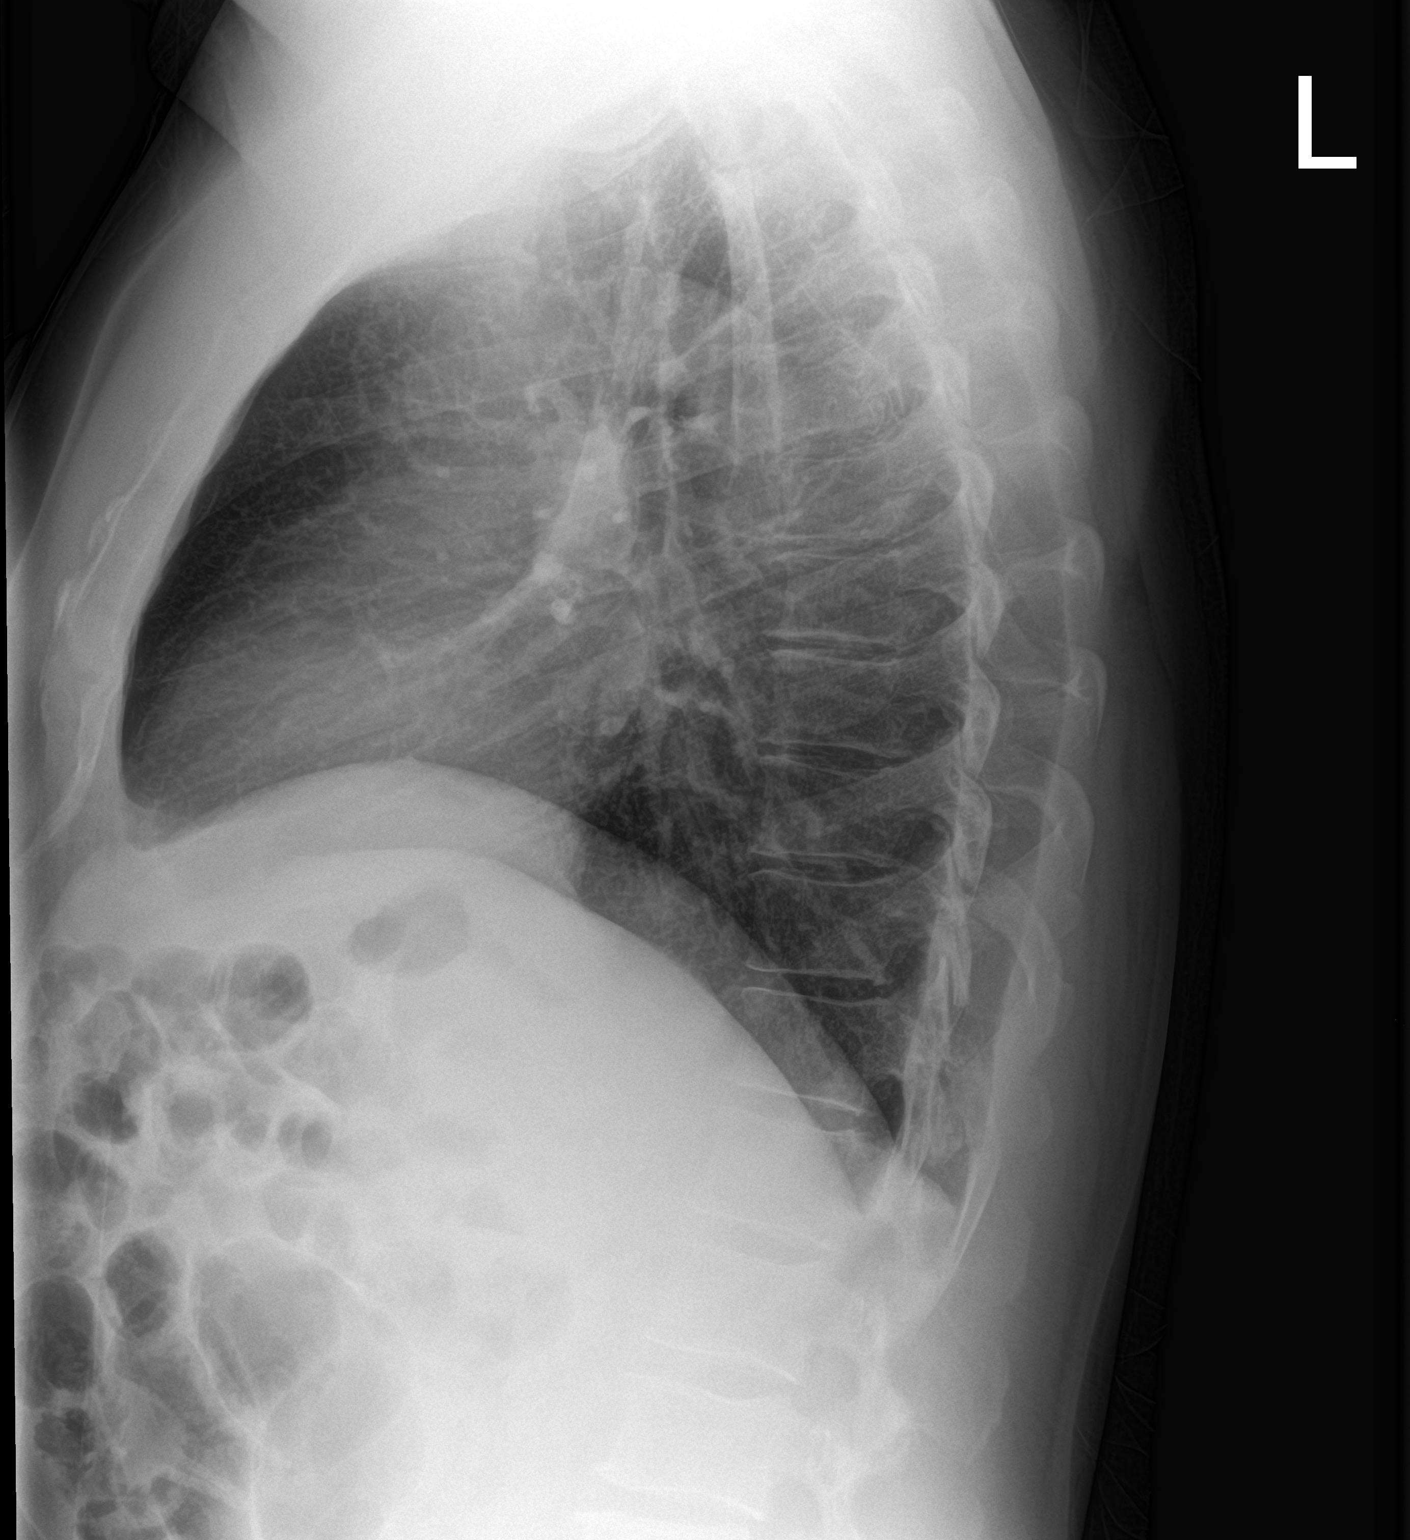

[2 of 2 positions shown; findings below may reference images not displayed]

FINDINGS: The lungs are borderline hypoinflated. There is no focal infiltrate.
There is no pleural effusion. The heart and pulmonary vascularity
are normal. The mediastinum is normal in width. The trachea is
midline. The bony thorax is unremarkable.
IMPRESSION: There is no active cardiopulmonary disease.
